# Patient Record
Sex: Female | Born: 1955 | Race: White | Hispanic: No | Marital: Single | Smoking: Current every day smoker
Health system: Southern US, Community
[De-identification: ages and names within clinical notes are randomized; demographics above are authoritative.]

## PROBLEM LIST (undated history)

## (undated) DIAGNOSIS — F32A Depression, unspecified: Secondary | ICD-10-CM

## (undated) DIAGNOSIS — D649 Anemia, unspecified: Secondary | ICD-10-CM

## (undated) DIAGNOSIS — F419 Anxiety disorder, unspecified: Secondary | ICD-10-CM

## (undated) DIAGNOSIS — E079 Disorder of thyroid, unspecified: Secondary | ICD-10-CM

## (undated) DIAGNOSIS — M810 Age-related osteoporosis without current pathological fracture: Secondary | ICD-10-CM

## (undated) DIAGNOSIS — T7840XA Allergy, unspecified, initial encounter: Secondary | ICD-10-CM

## (undated) DIAGNOSIS — F329 Major depressive disorder, single episode, unspecified: Secondary | ICD-10-CM

## (undated) HISTORY — DX: Allergy, unspecified, initial encounter: T78.40XA

## (undated) HISTORY — DX: Anxiety disorder, unspecified: F41.9

## (undated) HISTORY — DX: Depression, unspecified: F32.A

## (undated) HISTORY — DX: Anemia, unspecified: D64.9

## (undated) HISTORY — PX: UPPER GASTROINTESTINAL ENDOSCOPY: SHX188

## (undated) HISTORY — DX: Disorder of thyroid, unspecified: E07.9

## (undated) HISTORY — PX: HEMORRHOID SURGERY: SHX153

## (undated) HISTORY — DX: Age-related osteoporosis without current pathological fracture: M81.0

---
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
## 1995-01-22 HISTORY — PX: SPINE SURGERY: SHX786

## 1997-05-27 ENCOUNTER — Encounter: Admission: RE | Admit: 1997-05-27 | Discharge: 1997-05-27 | Payer: Self-pay | Admitting: Family Medicine

## 1997-05-30 ENCOUNTER — Encounter: Admission: RE | Admit: 1997-05-30 | Discharge: 1997-05-30 | Payer: Self-pay | Admitting: Family Medicine

## 1998-01-16 ENCOUNTER — Encounter: Admission: RE | Admit: 1998-01-16 | Discharge: 1998-01-16 | Payer: Self-pay | Admitting: Family Medicine

## 1999-01-05 ENCOUNTER — Encounter: Admission: RE | Admit: 1999-01-05 | Discharge: 1999-01-05 | Payer: Self-pay | Admitting: Family Medicine

## 1999-01-26 ENCOUNTER — Encounter: Admission: RE | Admit: 1999-01-26 | Discharge: 1999-01-26 | Payer: Self-pay | Admitting: Family Medicine

## 1999-02-05 ENCOUNTER — Encounter: Admission: RE | Admit: 1999-02-05 | Discharge: 1999-02-05 | Payer: Self-pay | Admitting: Family Medicine

## 1999-03-14 ENCOUNTER — Ambulatory Visit (HOSPITAL_COMMUNITY): Admission: RE | Admit: 1999-03-14 | Discharge: 1999-03-14 | Payer: Self-pay | Admitting: Family Medicine

## 1999-03-15 ENCOUNTER — Encounter: Payer: Self-pay | Admitting: Family Medicine

## 1999-03-19 ENCOUNTER — Encounter: Admission: RE | Admit: 1999-03-19 | Discharge: 1999-03-19 | Payer: Self-pay | Admitting: Family Medicine

## 1999-09-14 ENCOUNTER — Encounter: Admission: RE | Admit: 1999-09-14 | Discharge: 1999-09-14 | Payer: Self-pay | Admitting: Family Medicine

## 1999-09-19 ENCOUNTER — Encounter: Admission: RE | Admit: 1999-09-19 | Discharge: 1999-09-19 | Payer: Self-pay | Admitting: Family Medicine

## 1999-09-28 ENCOUNTER — Encounter: Admission: RE | Admit: 1999-09-28 | Discharge: 1999-09-28 | Payer: Self-pay | Admitting: Family Medicine

## 1999-12-03 ENCOUNTER — Encounter: Admission: RE | Admit: 1999-12-03 | Discharge: 1999-12-03 | Payer: Self-pay | Admitting: Family Medicine

## 2000-03-14 ENCOUNTER — Encounter: Admission: RE | Admit: 2000-03-14 | Discharge: 2000-03-14 | Payer: Self-pay | Admitting: Family Medicine

## 2000-12-26 ENCOUNTER — Encounter: Admission: RE | Admit: 2000-12-26 | Discharge: 2000-12-26 | Payer: Self-pay | Admitting: Family Medicine

## 2001-06-12 ENCOUNTER — Encounter: Admission: RE | Admit: 2001-06-12 | Discharge: 2001-06-12 | Payer: Self-pay | Admitting: Family Medicine

## 2001-06-29 ENCOUNTER — Encounter: Admission: RE | Admit: 2001-06-29 | Discharge: 2001-06-29 | Payer: Self-pay | Admitting: Family Medicine

## 2001-07-20 ENCOUNTER — Encounter: Admission: RE | Admit: 2001-07-20 | Discharge: 2001-07-20 | Payer: Self-pay | Admitting: Family Medicine

## 2002-01-26 ENCOUNTER — Encounter: Admission: RE | Admit: 2002-01-26 | Discharge: 2002-01-26 | Payer: Self-pay | Admitting: Family Medicine

## 2002-09-22 ENCOUNTER — Encounter: Admission: RE | Admit: 2002-09-22 | Discharge: 2002-09-22 | Payer: Self-pay | Admitting: Family Medicine

## 2002-11-15 ENCOUNTER — Encounter: Admission: RE | Admit: 2002-11-15 | Discharge: 2002-11-15 | Payer: Self-pay | Admitting: Family Medicine

## 2002-12-06 ENCOUNTER — Encounter: Admission: RE | Admit: 2002-12-06 | Discharge: 2002-12-06 | Payer: Self-pay | Admitting: Family Medicine

## 2002-12-09 ENCOUNTER — Encounter: Admission: RE | Admit: 2002-12-09 | Discharge: 2002-12-09 | Payer: Self-pay | Admitting: Sports Medicine

## 2003-01-03 ENCOUNTER — Encounter: Admission: RE | Admit: 2003-01-03 | Discharge: 2003-01-03 | Payer: Self-pay | Admitting: Family Medicine

## 2003-01-22 ENCOUNTER — Encounter (INDEPENDENT_AMBULATORY_CARE_PROVIDER_SITE_OTHER): Payer: Self-pay | Admitting: *Deleted

## 2003-01-22 LAB — CONVERTED CEMR LAB

## 2003-01-31 ENCOUNTER — Encounter: Admission: RE | Admit: 2003-01-31 | Discharge: 2003-01-31 | Payer: Self-pay | Admitting: Family Medicine

## 2003-02-01 ENCOUNTER — Encounter: Admission: RE | Admit: 2003-02-01 | Discharge: 2003-02-01 | Payer: Self-pay | Admitting: Family Medicine

## 2003-02-21 ENCOUNTER — Encounter: Admission: RE | Admit: 2003-02-21 | Discharge: 2003-02-21 | Payer: Self-pay | Admitting: Sports Medicine

## 2003-03-21 ENCOUNTER — Encounter: Admission: RE | Admit: 2003-03-21 | Discharge: 2003-03-21 | Payer: Self-pay | Admitting: Family Medicine

## 2003-11-17 ENCOUNTER — Ambulatory Visit: Payer: Self-pay | Admitting: Family Medicine

## 2003-11-22 ENCOUNTER — Ambulatory Visit: Payer: Self-pay | Admitting: Family Medicine

## 2003-12-12 ENCOUNTER — Ambulatory Visit: Payer: Self-pay | Admitting: Family Medicine

## 2003-12-26 ENCOUNTER — Ambulatory Visit: Payer: Self-pay | Admitting: Family Medicine

## 2004-01-27 ENCOUNTER — Ambulatory Visit: Payer: Self-pay | Admitting: Family Medicine

## 2004-02-05 ENCOUNTER — Emergency Department (HOSPITAL_COMMUNITY): Admission: EM | Admit: 2004-02-05 | Discharge: 2004-02-05 | Payer: Self-pay | Admitting: Family Medicine

## 2004-11-05 ENCOUNTER — Ambulatory Visit: Payer: Self-pay | Admitting: Family Medicine

## 2005-03-11 ENCOUNTER — Ambulatory Visit: Payer: Self-pay | Admitting: Family Medicine

## 2005-04-12 ENCOUNTER — Ambulatory Visit: Payer: Self-pay | Admitting: Family Medicine

## 2005-09-02 ENCOUNTER — Ambulatory Visit: Payer: Self-pay | Admitting: Family Medicine

## 2006-03-20 DIAGNOSIS — L719 Rosacea, unspecified: Secondary | ICD-10-CM

## 2006-03-20 DIAGNOSIS — F4323 Adjustment disorder with mixed anxiety and depressed mood: Secondary | ICD-10-CM

## 2006-03-20 DIAGNOSIS — E065 Other chronic thyroiditis: Secondary | ICD-10-CM

## 2006-03-20 DIAGNOSIS — F17200 Nicotine dependence, unspecified, uncomplicated: Secondary | ICD-10-CM

## 2006-03-20 DIAGNOSIS — M545 Low back pain, unspecified: Secondary | ICD-10-CM | POA: Insufficient documentation

## 2006-03-20 DIAGNOSIS — M5382 Other specified dorsopathies, cervical region: Secondary | ICD-10-CM

## 2006-03-20 DIAGNOSIS — R002 Palpitations: Secondary | ICD-10-CM

## 2006-03-20 DIAGNOSIS — E0590 Thyrotoxicosis, unspecified without thyrotoxic crisis or storm: Secondary | ICD-10-CM | POA: Insufficient documentation

## 2006-03-20 DIAGNOSIS — M479 Spondylosis, unspecified: Secondary | ICD-10-CM

## 2006-03-20 DIAGNOSIS — Z8639 Personal history of other endocrine, nutritional and metabolic disease: Secondary | ICD-10-CM

## 2006-03-20 HISTORY — DX: Spondylosis, unspecified: M47.9

## 2006-03-20 HISTORY — DX: Adjustment disorder with mixed anxiety and depressed mood: F43.23

## 2006-03-20 HISTORY — DX: Personal history of other endocrine, nutritional and metabolic disease: Z86.39

## 2006-03-20 HISTORY — DX: Rosacea, unspecified: L71.9

## 2006-03-20 HISTORY — DX: Nicotine dependence, unspecified, uncomplicated: F17.200

## 2006-03-21 ENCOUNTER — Encounter (INDEPENDENT_AMBULATORY_CARE_PROVIDER_SITE_OTHER): Payer: Self-pay | Admitting: *Deleted

## 2007-01-22 DIAGNOSIS — H269 Unspecified cataract: Secondary | ICD-10-CM

## 2007-01-22 HISTORY — DX: Unspecified cataract: H26.9

## 2007-01-22 HISTORY — PX: HEMORRHOID SURGERY: SHX153

## 2009-12-20 LAB — HM COLONOSCOPY: Colonoscopy, External: NORMAL

## 2010-12-19 ENCOUNTER — Encounter: Payer: Self-pay | Admitting: Family Medicine

## 2010-12-19 ENCOUNTER — Ambulatory Visit (INDEPENDENT_AMBULATORY_CARE_PROVIDER_SITE_OTHER): Payer: Self-pay | Admitting: Family Medicine

## 2010-12-19 VITALS — BP 99/74 | HR 77 | Temp 98.7°F | Ht 61.6 in | Wt 133.0 lb

## 2010-12-19 DIAGNOSIS — M8580 Other specified disorders of bone density and structure, unspecified site: Secondary | ICD-10-CM | POA: Insufficient documentation

## 2010-12-19 DIAGNOSIS — Z1239 Encounter for other screening for malignant neoplasm of breast: Secondary | ICD-10-CM

## 2010-12-19 DIAGNOSIS — F17200 Nicotine dependence, unspecified, uncomplicated: Secondary | ICD-10-CM

## 2010-12-19 DIAGNOSIS — E7800 Pure hypercholesterolemia, unspecified: Secondary | ICD-10-CM

## 2010-12-19 DIAGNOSIS — Z23 Encounter for immunization: Secondary | ICD-10-CM

## 2010-12-19 DIAGNOSIS — Z1231 Encounter for screening mammogram for malignant neoplasm of breast: Secondary | ICD-10-CM

## 2010-12-19 DIAGNOSIS — Z0000 Encounter for general adult medical examination without abnormal findings: Secondary | ICD-10-CM | POA: Insufficient documentation

## 2010-12-19 DIAGNOSIS — K635 Polyp of colon: Secondary | ICD-10-CM

## 2010-12-19 DIAGNOSIS — D126 Benign neoplasm of colon, unspecified: Secondary | ICD-10-CM

## 2010-12-19 DIAGNOSIS — Z111 Encounter for screening for respiratory tuberculosis: Secondary | ICD-10-CM

## 2010-12-19 DIAGNOSIS — M899 Disorder of bone, unspecified: Secondary | ICD-10-CM

## 2010-12-19 DIAGNOSIS — E0500 Thyrotoxicosis with diffuse goiter without thyrotoxic crisis or storm: Secondary | ICD-10-CM

## 2010-12-19 DIAGNOSIS — F329 Major depressive disorder, single episode, unspecified: Secondary | ICD-10-CM

## 2010-12-19 HISTORY — DX: Polyp of colon: K63.5

## 2010-12-19 HISTORY — DX: Encounter for general adult medical examination without abnormal findings: Z00.00

## 2010-12-19 HISTORY — DX: Other specified disorders of bone density and structure, unspecified site: M85.80

## 2010-12-19 HISTORY — DX: Pure hypercholesterolemia, unspecified: E78.00

## 2010-12-19 LAB — LIPID PANEL
Cholesterol: 224 mg/dL — ABNORMAL HIGH (ref 0–200)
Total CHOL/HDL Ratio: 4 Ratio

## 2010-12-19 LAB — COMPLETE METABOLIC PANEL WITH GFR
Albumin: 4.8 g/dL (ref 3.5–5.2)
Alkaline Phosphatase: 59 U/L (ref 39–117)
BUN: 12 mg/dL (ref 6–23)
CO2: 28 mEq/L (ref 19–32)
Calcium: 10 mg/dL (ref 8.4–10.5)
Chloride: 104 mEq/L (ref 96–112)
GFR, Est African American: >89 mL/min
GFR, Est Non African American: >89 mL/min
Glucose, Bld: 87 mg/dL (ref 70–99)
Potassium: 4.1 mEq/L (ref 3.5–5.3)
Sodium: 142 mEq/L (ref 135–145)
Total Protein: 7 g/dL (ref 6.0–8.3)

## 2010-12-19 LAB — TSH: TSH: 1.588 u[IU]/mL (ref 0.350–4.500)

## 2010-12-19 NOTE — Assessment & Plan Note (Signed)
Provided counseling resources.

## 2010-12-19 NOTE — Progress Notes (Signed)
  Subjective:    Patient ID: Anna Valenzuela, female    DOB: Mar 20, 1955, 55 y.o.   MRN: [id number]  HPI Establish care.  Actually, I have known Kaydan for years, but she has been absent from my practice for about three years.  Now returning. Difficulty sleeping. History of low bone density and low vitamin D Needs pap - prefers female provider for that aspect of care.  Never abnormal pap so q3y OK.  Wants to be checked for STDs at that visit. Needs colonoscopy: had one about three years ago and told multiple polyps and needed repeat in 3 years (now.) Depression: emerging from another down cycle of her chronic depression.  She has just broken up with her latest husband.  Has not done well on antidepressants in the past.  She has moved to [facility] and I instructed her on available counciling services.    Review of Systems     Objective:   Physical Exam Lungs clear Cardiac RRR Abd benign         Assessment & Plan:

## 2010-12-19 NOTE — Assessment & Plan Note (Signed)
Urged cessation 

## 2010-12-19 NOTE — Assessment & Plan Note (Signed)
Told polyps and due for colonoscopy.  Will get old records and refer

## 2010-12-19 NOTE — Assessment & Plan Note (Addendum)
Told by last MD.  [facility] Du[facility] labs.  Since I do not know pharmacy, will send printed script with results letter.

## 2010-12-19 NOTE — Assessment & Plan Note (Signed)
Check TSH.  Has been euthyroid of late.

## 2010-12-19 NOTE — Patient Instructions (Signed)
Great seeing you again I will call with blood work results See one of our female providers soon for a pap smear and STD check The nurse will set you up for a mammogram and GI referral for colonoscopy You got a tetanus shot today Come back Friday to have your TB test read See me after the new year when we can talk more.

## 2010-12-19 NOTE — Assessment & Plan Note (Signed)
Told both osteopenia and low vitamin D by last MD. Hennie Duos labs

## 2010-12-19 NOTE — Assessment & Plan Note (Signed)
Needs mammogram

## 2010-12-20 ENCOUNTER — Encounter: Payer: Self-pay | Admitting: Family Medicine

## 2010-12-20 LAB — VITAMIN D 25 HYDROXY (VIT D DEFICIENCY, FRACTURES): Vit D, 25-Hydroxy: 51 ng/mL (ref 30–89)

## 2010-12-20 MED ORDER — PRAVASTATIN SODIUM 40 MG PO TABS: 40.0000 mg | ORAL_TABLET | Freq: Every evening | ORAL | Status: DC

## 2010-12-20 NOTE — Progress Notes (Signed)
Addended by: Deno Etienne on: 12/20/2010 05:34 PM   Modules accepted: Orders

## 2010-12-20 NOTE — Progress Notes (Signed)
  Subjective:    Patient ID: Anna Valenzuela, female    DOB: 1955-12-08, 55 y.o.   MRN: [id number]  HPI Called and left message plus will send a letter.  Will start on statin for high LDL.   Review of Systems     Objective:   Physical Exam        Assessment & Plan:

## 2010-12-20 NOTE — Progress Notes (Signed)
Addended by: Tivis Ringer on: 12/20/2010 10:25 AM   Modules accepted: Orders

## 2010-12-21 ENCOUNTER — Telehealth: Payer: Self-pay | Admitting: *Deleted

## 2010-12-21 ENCOUNTER — Ambulatory Visit (INDEPENDENT_AMBULATORY_CARE_PROVIDER_SITE_OTHER): Payer: Self-pay | Admitting: *Deleted

## 2010-12-21 DIAGNOSIS — IMO0001 Reserved for inherently not codable concepts without codable children: Secondary | ICD-10-CM

## 2010-12-21 DIAGNOSIS — Z111 Encounter for screening for respiratory tuberculosis: Secondary | ICD-10-CM

## 2010-12-21 LAB — TB SKIN TEST: TB Skin Test: NEGATIVE mm

## 2010-12-21 NOTE — Telephone Encounter (Signed)
Has been to see [facility] - 2010 - colonoscopy & hemorrhoid surgery 2011  Will be coming in today to have PPD read and will sign a release form to get this info from her PCP in [place].

## 2010-12-21 NOTE — Progress Notes (Signed)
Addended by: Deno Etienne on: 12/21/2010 12:02 PM   Modules accepted: Orders

## 2010-12-21 NOTE — Telephone Encounter (Signed)
Called pt to ask if she has ever seen a GI doctor or had a colonoscopy done.Anna Valenzuela [place]

## 2011-02-28 ENCOUNTER — Ambulatory Visit (INDEPENDENT_AMBULATORY_CARE_PROVIDER_SITE_OTHER): Payer: Self-pay | Admitting: Family Medicine

## 2011-02-28 VITALS — BP 99/76 | HR 93 | Ht 61.5 in | Wt 130.0 lb

## 2011-02-28 DIAGNOSIS — M62830 Muscle spasm of back: Secondary | ICD-10-CM | POA: Insufficient documentation

## 2011-02-28 DIAGNOSIS — M539 Dorsopathy, unspecified: Secondary | ICD-10-CM

## 2011-02-28 MED ORDER — ACETAMINOPHEN-CODEINE #3 300-30 MG PO TABS: 1.0000 | ORAL_TABLET | Freq: Four times a day (QID) | ORAL | Status: AC | PRN

## 2011-02-28 NOTE — Assessment & Plan Note (Signed)
Aggravated by moving her family. Likely worsened by the increased stress she's been having. She is already taking methocarbamol which is provided by her primary care physician. She therefore does not need any further muscle relaxants. Provided her with Tylenol #3 as noted for relief. She states this has helped her in the past when she has had similar pain. Also recommended massage and heat. Followup for prevention weeks. No red flags by history or exam.

## 2011-02-28 NOTE — Progress Notes (Signed)
  Subjective:    Patient ID: Anna Valenzuela, female    DOB: April 07, 1955, 56 y.o.   MRN: [id number]  HPI Back pain:  Describes aching pain in upper region of back and neck, worse when moveing Left arm.  Pain is 8 / 10, not relieved with ibuprofen 800 mg or methocarbamol.  Recently helped her family moved from [place] to [place]. This is a very stressful move as her son had lost his house secondary to credit problems.  Has had increased stresses at home and at work.  No injuries to her back.  No numbness or paresthesias to bilateral lower extremities.  No LE weakness or changes in gait.  No fevers or chills.  No incontinence of bladder or bowel.    Of note she thought she was having chest pain he called EMS on Tuesday. They came and did an EKG which was within normal limits. She provides the original copy of EKG in clinic here today. As her EKG was good and she began having more back and not chest pain she refused to the hospital. She denied any further chest pain since then.   Review of Systems See HPI above for review of systems.       Objective:   Physical Exam Gen:  Alert, cooperative patient who appears stated age in no acute distress.  Vital signs reviewed.  Patient is sitting and holding her left arm close to her side. Back - Normal skin, Spine with normal alignment and no deformity.  No tenderness to vertebral process palpation.  Paraspinous muscles are tender on left side. Multiple triggerpoints noted. Also tender throughout trapezius on left side. Some minimal tenderness across left side of chest..   Range of motion is full at neck and lumbar sacral regions         Assessment & Plan:

## 2011-02-28 NOTE — Patient Instructions (Signed)
Take 0.5 to 1 tab for relief. Use heat and massage as much as possible. Come back and see me in 2 weeks so we can make sure you're okay.

## 2011-03-07 ENCOUNTER — Encounter: Payer: Self-pay | Admitting: Gastroenterology

## 2011-03-13 ENCOUNTER — Ambulatory Visit: Payer: Self-pay | Admitting: Family Medicine

## 2011-04-05 ENCOUNTER — Ambulatory Visit (INDEPENDENT_AMBULATORY_CARE_PROVIDER_SITE_OTHER): Payer: Self-pay | Admitting: Family Medicine

## 2011-04-05 ENCOUNTER — Encounter: Payer: Self-pay | Admitting: Family Medicine

## 2011-04-05 VITALS — BP 100/66 | HR 63 | Temp 98.3°F | Ht 61.5 in | Wt 131.0 lb

## 2011-04-05 DIAGNOSIS — J329 Chronic sinusitis, unspecified: Secondary | ICD-10-CM

## 2011-04-05 DIAGNOSIS — B9789 Other viral agents as the cause of diseases classified elsewhere: Secondary | ICD-10-CM

## 2011-04-05 NOTE — Progress Notes (Signed)
  Subjective:    Patient ID: Anna Valenzuela, female    DOB: Dec 01, 1955, 56 y.o.   MRN: [id number]  HPI Sinus congestion: X5 days. Also has positive cough, sinus pressure in area of maxillary sinuses and frontal sinus. Positive dental pain on left side.  Patient also endorses watery eyes, itchy eyes, positive nasal drainage, positive sneezing. No fever. No nausea vomiting or diarrhea. Did have a GI bug approximately one to 2 weeks ago. This is now resolved. Was feeling better until this came on 5 days ago. Patient's grandson is now living with her as of recently. She goes into the school to pick him up. Is concerned she may picked up a virus better. Difficulty sleeping do to symptoms.  Review of Systems    as per above. Objective:   Physical Exam  HENT:  Head: Normocephalic and atraumatic.  Right Ear: External ear normal.  Left Ear: External ear normal.  Mouth/Throat: No oropharyngeal exudate.       inflammed nasal mucosa. Clear nasal drainage  + tenderness to palpation over maxillary area and forehead. Mild throat erythema.   Eyes: Pupils are equal, round, and reactive to light. Right eye exhibits no discharge. Left eye exhibits no discharge.  Neck: Neck supple.  Cardiovascular: Normal rate, regular rhythm and normal heart sounds.   No murmur heard. Pulmonary/Chest: Effort normal and breath sounds normal. No respiratory distress. She has no wheezes. She has no rales.  Abdominal: Soft. She exhibits no distension. There is no tenderness.  Musculoskeletal: She exhibits no edema.  Lymphadenopathy:    She has no cervical adenopathy.  Neurological: She is alert.  Skin: Skin is warm. No rash noted.          Assessment & Plan:

## 2011-04-05 NOTE — Patient Instructions (Signed)
Viral:   Nasal congestion: Nasal saline spray as needed Afrin as directed for 3 days only. Body aches, sinus pain: Motrin 800mg  every 8 hours as needed.  Cough: Honey as needed, mucinex as needed for cough.  Allergic: Claritin or zyrtec as directed.  I want you to return if: High fevers, new or worsening of symptoms, or if symptoms last longer than 2 weeks.

## 2011-04-05 NOTE — Assessment & Plan Note (Signed)
Symptoms consistent with viral sinusitis- may have an allergic component.  See pt instructions- symptomatic treatment only at this time.  Reviewed red flags for return with patient.

## 2011-04-10 ENCOUNTER — Ambulatory Visit (INDEPENDENT_AMBULATORY_CARE_PROVIDER_SITE_OTHER): Payer: Self-pay | Admitting: Family Medicine

## 2011-04-10 ENCOUNTER — Encounter: Payer: Self-pay | Admitting: Family Medicine

## 2011-04-10 VITALS — BP 108/74 | HR 83 | Temp 98.4°F | Ht 61.5 in | Wt 130.0 lb

## 2011-04-10 DIAGNOSIS — J329 Chronic sinusitis, unspecified: Secondary | ICD-10-CM

## 2011-04-10 MED ORDER — MOMETASONE FUROATE 50 MCG/ACT NA SUSP: 2.0000 | Freq: Every day | NASAL | Status: DC

## 2011-04-10 MED ORDER — AZITHROMYCIN 250 MG PO TABS: ORAL_TABLET | ORAL | Status: AC

## 2011-04-10 NOTE — Patient Instructions (Signed)

## 2011-04-10 NOTE — Progress Notes (Signed)
  Subjective:    Patient ID: Anna Valenzuela, female    DOB: 1955/08/18, 56 y.o.   MRN: [id number]  URI  This is a recurrent problem. The current episode started 1 to 4 weeks ago. The maximum temperature recorded prior to her arrival was 100 - 100.9 F. The fever has been present for 3 to 4 days. Associated symptoms include coughing, rhinorrhea, sinus pain and a sore throat. Pertinent negatives include no abdominal pain, diarrhea, dysuria, nausea or wheezing. She has tried antihistamine and decongestant for the symptoms. The treatment provided no relief.  Seen here last week and has not improved.  Mucous drainage is now thick, green and is coughing more.  Still supports sinus tenderness, tooth pain, left ear pain.    Review of Systems  Constitutional: Negative for diaphoresis and fatigue.  HENT: Positive for sore throat and rhinorrhea.   Respiratory: Positive for cough. Negative for wheezing.   Gastrointestinal: Negative for nausea, abdominal pain and diarrhea.  Genitourinary: Negative for dysuria and difficulty urinating.  Musculoskeletal: Negative for back pain.       Objective:   Physical Exam  Vitals reviewed. Constitutional: She is oriented to person, place, and time. She appears well-developed and well-nourished.  HENT:  Head: Atraumatic.  Right Ear: Tympanic membrane and ear canal normal.  Left Ear: Ear canal normal. Tympanic membrane is bulging. A middle ear effusion is present.  Nose: Mucosal edema and rhinorrhea present. Left sinus exhibits maxillary sinus tenderness.  Mouth/Throat: Posterior oropharyngeal edema present. No oropharyngeal exudate.  Eyes: No scleral icterus.  Neck: Neck supple.  Cardiovascular: Normal rate and regular rhythm.   Pulmonary/Chest: Effort normal and breath sounds normal.  Lymphadenopathy:    She has no cervical adenopathy.  Neurological: She is alert and oriented to person, place, and time.          Assessment & Plan:   1. Sinusitis   azithromycin (ZITHROMAX Z-PAK) 250 MG tablet

## 2011-06-28 ENCOUNTER — Ambulatory Visit (INDEPENDENT_AMBULATORY_CARE_PROVIDER_SITE_OTHER): Payer: Self-pay | Admitting: Family Medicine

## 2011-06-28 ENCOUNTER — Encounter: Payer: Self-pay | Admitting: Family Medicine

## 2011-06-28 VITALS — BP 100/68 | HR 76 | Temp 98.3°F | Ht 61.6 in | Wt 131.0 lb

## 2011-06-28 DIAGNOSIS — F329 Major depressive disorder, single episode, unspecified: Secondary | ICD-10-CM

## 2011-06-28 DIAGNOSIS — D126 Benign neoplasm of colon, unspecified: Secondary | ICD-10-CM

## 2011-06-28 DIAGNOSIS — E0500 Thyrotoxicosis with diffuse goiter without thyrotoxic crisis or storm: Secondary | ICD-10-CM

## 2011-06-28 DIAGNOSIS — E7800 Pure hypercholesterolemia, unspecified: Secondary | ICD-10-CM

## 2011-06-28 DIAGNOSIS — K635 Polyp of colon: Secondary | ICD-10-CM

## 2011-06-28 DIAGNOSIS — M479 Spondylosis, unspecified: Secondary | ICD-10-CM

## 2011-06-28 MED ORDER — LORAZEPAM 0.5 MG PO TABS: 0.5000 mg | ORAL_TABLET | Freq: Three times a day (TID) | ORAL | Status: DC | PRN

## 2011-06-28 MED ORDER — IBUPROFEN 800 MG PO TABS: 800.0000 mg | ORAL_TABLET | Freq: Three times a day (TID) | ORAL | Status: DC | PRN

## 2011-06-28 MED ORDER — PRAVASTATIN SODIUM 40 MG PO TABS: 40.0000 mg | ORAL_TABLET | Freq: Every evening | ORAL | Status: DC

## 2011-06-28 MED ORDER — METHOCARBAMOL 750 MG PO TABS: 750.0000 mg | ORAL_TABLET | Freq: Four times a day (QID) | ORAL | Status: DC | PRN

## 2011-06-28 NOTE — Patient Instructions (Signed)
Get your meds filled, including the cholesterol medication. Come back at your earliest convenience to Dr. Shawnie Pons for a Pap smear As soon as you can, you need another colonoscopy. Please get your mammogram done. I will call with the thyroid results.

## 2011-06-30 NOTE — Assessment & Plan Note (Signed)
Has not been taking meds.  I will need to recheck when on meds and titrate up dose.

## 2011-06-30 NOTE — Assessment & Plan Note (Addendum)
Due for colonoscopy.  She knows but the finances are difficult.
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
## 2011-06-30 NOTE — Assessment & Plan Note (Signed)
Recheck TSH 

## 2011-07-18 ENCOUNTER — Telehealth: Payer: Self-pay | Admitting: Family Medicine

## 2011-07-18 NOTE — Telephone Encounter (Signed)
Pt rec'd message from Dr Leveda Anna and she doesn't want to change meds - she is very satisfied with Prevastatin and doesn't want to change.

## 2011-07-19 NOTE — Telephone Encounter (Signed)
MAP forms completed.

## 2011-07-22 ENCOUNTER — Encounter: Payer: Self-pay | Admitting: Family Medicine

## 2011-07-22 ENCOUNTER — Other Ambulatory Visit (HOSPITAL_COMMUNITY)
Admission: RE | Admit: 2011-07-22 | Discharge: 2011-07-22 | Disposition: A | Discharge status: Home / Self Care | Payer: Self-pay | Source: Ambulatory Visit | Attending: Family Medicine | Admitting: Family Medicine

## 2011-07-22 ENCOUNTER — Ambulatory Visit (INDEPENDENT_AMBULATORY_CARE_PROVIDER_SITE_OTHER): Payer: Self-pay | Admitting: Family Medicine

## 2011-07-22 VITALS — BP 95/66 | HR 80 | Temp 98.6°F | Wt 126.0 lb

## 2011-07-22 DIAGNOSIS — Z01419 Encounter for gynecological examination (general) (routine) without abnormal findings: Secondary | ICD-10-CM

## 2011-07-22 DIAGNOSIS — Z1231 Encounter for screening mammogram for malignant neoplasm of breast: Secondary | ICD-10-CM

## 2011-07-22 DIAGNOSIS — Z124 Encounter for screening for malignant neoplasm of cervix: Secondary | ICD-10-CM

## 2011-07-22 NOTE — Patient Instructions (Signed)
Preventive Care for Adults, Female A healthy lifestyle and preventive care can promote health and wellness. Preventive health guidelines for women include the following key practices.  A routine yearly physical is a good way to check with your caregiver about your health and preventive screening. It is a chance to share any concerns and updates on your health, and to receive a thorough exam.   Visit your dentist for a routine exam and preventive care every 6 months. Brush your teeth twice a day and floss once a day. Good oral hygiene prevents tooth decay and gum disease.   The frequency of eye exams is based on your age, health, family medical history, use of contact lenses, and other factors. Follow your caregiver's recommendations for frequency of eye exams.   Eat a healthy diet. Foods like vegetables, fruits, whole grains, low-fat dairy products, and lean protein foods contain the nutrients you need without too many calories. Decrease your intake of foods high in solid fats, added sugars, and salt. Eat the right amount of calories for you.Get information about a proper diet from your caregiver, if necessary.   Regular physical exercise is one of the most important things you can do for your health. Most adults should get at least 150 minutes of moderate-intensity exercise (any activity that increases your heart rate and causes you to sweat) each week. In addition, most adults need muscle-strengthening exercises on 2 or more days a week.   Maintain a healthy weight. The body mass index (BMI) is a screening tool to identify possible weight problems. It provides an estimate of body fat based on height and weight. Your caregiver can help determine your BMI, and can help you achieve or maintain a healthy weight.For adults 20 years and older:   A BMI below 18.5 is considered underweight.   A BMI of 18.5 to 24.9 is normal.   A BMI of 25 to 29.9 is considered overweight.   A BMI of 30 and above is  considered obese.   Maintain normal blood lipids and cholesterol levels by exercising and minimizing your intake of saturated fat. Eat a balanced diet with plenty of fruit and vegetables. Blood tests for lipids and cholesterol should begin at age 20 and be repeated every 5 years. If your lipid or cholesterol levels are high, you are over 50, or you are at high risk for heart disease, you may need your cholesterol levels checked more frequently.Ongoing high lipid and cholesterol levels should be treated with medicines if diet and exercise are not effective.   If you smoke, find out from your caregiver how to quit. If you do not use tobacco, do not start.   If you are pregnant, do not drink alcohol. If you are breastfeeding, be very cautious about drinking alcohol. If you are not pregnant and choose to drink alcohol, do not exceed 1 drink per day. One drink is considered to be 12 ounces (355 mL) of beer, 5 ounces (148 mL) of wine, or 1.5 ounces (44 mL) of liquor.   Avoid use of street drugs. Do not share needles with anyone. Ask for help if you need support or instructions about stopping the use of drugs.   High blood pressure causes heart disease and increases the risk of stroke. Your blood pressure should be checked at least every 1 to 2 years. Ongoing high blood pressure should be treated with medicines if weight loss and exercise are not effective.   If you are 55 to 55   years old, ask your caregiver if you should take aspirin to prevent strokes.   Diabetes screening involves taking a blood sample to check your fasting blood sugar level. This should be done once every 3 years, after age 45, if you are within normal weight and without risk factors for diabetes. Testing should be considered at a younger age or be carried out more frequently if you are overweight and have at least 1 risk factor for diabetes.   Breast cancer screening is essential preventive care for women. You should practice "breast  self-awareness." This means understanding the normal appearance and feel of your breasts and may include breast self-examination. Any changes detected, no matter how small, should be reported to a caregiver. Women in their 20s and 30s should have a clinical breast exam (CBE) by a caregiver as part of a regular health exam every 1 to 3 years. After age 40, women should have a CBE every year. Starting at age 40, women should consider having a mammography (breast X-ray test) every year. Women who have a family history of breast cancer should talk to their caregiver about genetic screening. Women at a high risk of breast cancer should talk to their caregivers about having magnetic resonance imaging (MRI) and a mammography every year.   The Pap test is a screening test for cervical cancer. A Pap test can show cell changes on the cervix that might become cervical cancer if left untreated. A Pap test is a procedure in which cells are obtained and examined from the lower end of the uterus (cervix).   Women should have a Pap test starting at age 21.   Between ages 21 and 29, Pap tests should be repeated every 2 years.   Beginning at age 30, you should have a Pap test every 3 years as long as the past 3 Pap tests have been normal.   Some women have medical problems that increase the chance of getting cervical cancer. Talk to your caregiver about these problems. It is especially important to talk to your caregiver if a new problem develops soon after your last Pap test. In these cases, your caregiver may recommend more frequent screening and Pap tests.   The above recommendations are the same for women who have or have not gotten the vaccine for human papillomavirus (HPV).   If you had a hysterectomy for a problem that was not cancer or a condition that could lead to cancer, then you no longer need Pap tests. Even if you no longer need a Pap test, a regular exam is a good idea to make sure no other problems are  starting.   If you are between ages 65 and 70, and you have had normal Pap tests going back 10 years, you no longer need Pap tests. Even if you no longer need a Pap test, a regular exam is a good idea to make sure no other problems are starting.   If you have had past treatment for cervical cancer or a condition that could lead to cancer, you need Pap tests and screening for cancer for at least 20 years after your treatment.   If Pap tests have been discontinued, risk factors (such as a new sexual partner) need to be reassessed to determine if screening should be resumed.   The HPV test is an additional test that may be used for cervical cancer screening. The HPV test looks for the virus that can cause the cell changes on the cervix.   The cells collected during the Pap test can be tested for HPV. The HPV test could be used to screen women aged 30 years and older, and should be used in women of any age who have unclear Pap test results. After the age of 30, women should have HPV testing at the same frequency as a Pap test.   Colorectal cancer can be detected and often prevented. Most routine colorectal cancer screening begins at the age of 50 and continues through age 75. However, your caregiver may recommend screening at an earlier age if you have risk factors for colon cancer. On a yearly basis, your caregiver may provide home test kits to check for hidden blood in the stool. Use of a small camera at the end of a tube, to directly examine the colon (sigmoidoscopy or colonoscopy), can detect the earliest forms of colorectal cancer. Talk to your caregiver about this at age 50, when routine screening begins. Direct examination of the colon should be repeated every 5 to 10 years through age 75, unless early forms of pre-cancerous polyps or small growths are found.   Hepatitis C blood testing is recommended for all people born from 1945 through 1965 and any individual with known risks for hepatitis C.    Practice safe sex. Use condoms and avoid high-risk sexual practices to reduce the spread of sexually transmitted infections (STIs). STIs include gonorrhea, chlamydia, syphilis, trichomonas, herpes, HPV, and human immunodeficiency virus (HIV). Herpes, HIV, and HPV are viral illnesses that have no cure. They can result in disability, cancer, and death. Sexually active women aged 25 and younger should be checked for chlamydia. Older women with new or multiple partners should also be tested for chlamydia. Testing for other STIs is recommended if you are sexually active and at increased risk.   Osteoporosis is a disease in which the bones lose minerals and strength with aging. This can result in serious bone fractures. The risk of osteoporosis can be identified using a bone density scan. Women ages 65 and over and women at risk for fractures or osteoporosis should discuss screening with their caregivers. Ask your caregiver whether you should take a calcium supplement or vitamin D to reduce the rate of osteoporosis.   Menopause can be associated with physical symptoms and risks. Hormone replacement therapy is available to decrease symptoms and risks. You should talk to your caregiver about whether hormone replacement therapy is right for you.   Use sunscreen with sun protection factor (SPF) of 30 or more. Apply sunscreen liberally and repeatedly throughout the day. You should seek shade when your shadow is shorter than you. Protect yourself by wearing long sleeves, pants, a wide-brimmed hat, and sunglasses year round, whenever you are outdoors.   Once a month, do a whole body skin exam, using a mirror to look at the skin on your back. Notify your caregiver of new moles, moles that have irregular borders, moles that are larger than a pencil eraser, or moles that have changed in shape or color.   Stay current with required immunizations.   Influenza. You need a dose every fall (or winter). The composition of  the flu vaccine changes each year, so being vaccinated once is not enough.   Pneumococcal polysaccharide. You need 1 to 2 doses if you smoke cigarettes or if you have certain chronic medical conditions. You need 1 dose at age 65 (or older) if you have never been vaccinated.   Tetanus, diphtheria, pertussis (Tdap, Td). Get 1 dose of   Tdap vaccine if you are younger than age 65, are over 65 and have contact with an infant, are a healthcare worker, are pregnant, or simply want to be protected from whooping cough. After that, you need a Td booster dose every 10 years. Consult your caregiver if you have not had at least 3 tetanus and diphtheria-containing shots sometime in your life or have a deep or dirty wound.   HPV. You need this vaccine if you are a woman age 26 or younger. The vaccine is given in 3 doses over 6 months.   Measles, mumps, rubella (MMR). You need at least 1 dose of MMR if you were born in 1957 or later. You may also need a second dose.   Meningococcal. If you are age 19 to 21 and a first-year college student living in a residence hall, or have one of several medical conditions, you need to get vaccinated against meningococcal disease. You may also need additional booster doses.   Zoster (shingles). If you are age 60 or older, you should get this vaccine.   Varicella (chickenpox). If you have never had chickenpox or you were vaccinated but received only 1 dose, talk to your caregiver to find out if you need this vaccine.   Hepatitis A. You need this vaccine if you have a specific risk factor for hepatitis A virus infection or you simply wish to be protected from this disease. The vaccine is usually given as 2 doses, 6 to 18 months apart.   Hepatitis B. You need this vaccine if you have a specific risk factor for hepatitis B virus infection or you simply wish to be protected from this disease. The vaccine is given in 3 doses, usually over 6 months.  Preventive Services /  Frequency Ages 19 to 39  Blood pressure check.** / Every 1 to 2 years.   Lipid and cholesterol check.** / Every 5 years beginning at age 20.   Clinical breast exam.** / Every 3 years for women in their 20s and 30s.   Pap test.** / Every 2 years from ages 21 through 29. Every 3 years starting at age 30 through age 65 or 70 with a history of 3 consecutive normal Pap tests.   HPV screening.** / Every 3 years from ages 30 through ages 65 to 70 with a history of 3 consecutive normal Pap tests.   Hepatitis C blood test.** / For any individual with known risks for hepatitis C.   Skin self-exam. / Monthly.   Influenza immunization.** / Every year.   Pneumococcal polysaccharide immunization.** / 1 to 2 doses if you smoke cigarettes or if you have certain chronic medical conditions.   Tetanus, diphtheria, pertussis (Tdap, Td) immunization. / A one-time dose of Tdap vaccine. After that, you need a Td booster dose every 10 years.   HPV immunization. / 3 doses over 6 months, if you are 26 and younger.   Measles, mumps, rubella (MMR) immunization. / You need at least 1 dose of MMR if you were born in 1957 or later. You may also need a second dose.   Meningococcal immunization. / 1 dose if you are age 19 to 21 and a first-year college student living in a residence hall, or have one of several medical conditions, you need to get vaccinated against meningococcal disease. You may also need additional booster doses.   Varicella immunization.** / Consult your caregiver.   Hepatitis A immunization.** / Consult your caregiver. 2 doses, 6 to 18 months   apart.   Hepatitis B immunization.** / Consult your caregiver. 3 doses usually over 6 months.  Ages 40 to 64  Blood pressure check.** / Every 1 to 2 years.   Lipid and cholesterol check.** / Every 5 years beginning at age 20.   Clinical breast exam.** / Every year after age 40.   Mammogram.** / Every year beginning at age 40 and continuing for as  long as you are in good health. Consult with your caregiver.   Pap test.** / Every 3 years starting at age 30 through age 65 or 70 with a history of 3 consecutive normal Pap tests.   HPV screening.** / Every 3 years from ages 30 through ages 65 to 70 with a history of 3 consecutive normal Pap tests.   Fecal occult blood test (FOBT) of stool. / Every year beginning at age 50 and continuing until age 75. You may not need to do this test if you get a colonoscopy every 10 years.   Flexible sigmoidoscopy or colonoscopy.** / Every 5 years for a flexible sigmoidoscopy or every 10 years for a colonoscopy beginning at age 50 and continuing until age 75.   Hepatitis C blood test.** / For all people born from 1945 through 1965 and any individual with known risks for hepatitis C.   Skin self-exam. / Monthly.   Influenza immunization.** / Every year.   Pneumococcal polysaccharide immunization.** / 1 to 2 doses if you smoke cigarettes or if you have certain chronic medical conditions.   Tetanus, diphtheria, pertussis (Tdap, Td) immunization.** / A one-time dose of Tdap vaccine. After that, you need a Td booster dose every 10 years.   Measles, mumps, rubella (MMR) immunization. / You need at least 1 dose of MMR if you were born in 1957 or later. You may also need a second dose.   Varicella immunization.** / Consult your caregiver.   Meningococcal immunization.** / Consult your caregiver.   Hepatitis A immunization.** / Consult your caregiver. 2 doses, 6 to 18 months apart.   Hepatitis B immunization.** / Consult your caregiver. 3 doses, usually over 6 months.  Ages 65 and over  Blood pressure check.** / Every 1 to 2 years.   Lipid and cholesterol check.** / Every 5 years beginning at age 20.   Clinical breast exam.** / Every year after age 40.   Mammogram.** / Every year beginning at age 40 and continuing for as long as you are in good health. Consult with your caregiver.   Pap test.** /  Every 3 years starting at age 30 through age 65 or 70 with a 3 consecutive normal Pap tests. Testing can be stopped between 65 and 70 with 3 consecutive normal Pap tests and no abnormal Pap or HPV tests in the past 10 years.   HPV screening.** / Every 3 years from ages 30 through ages 65 or 70 with a history of 3 consecutive normal Pap tests. Testing can be stopped between 65 and 70 with 3 consecutive normal Pap tests and no abnormal Pap or HPV tests in the past 10 years.   Fecal occult blood test (FOBT) of stool. / Every year beginning at age 50 and continuing until age 75. You may not need to do this test if you get a colonoscopy every 10 years.   Flexible sigmoidoscopy or colonoscopy.** / Every 5 years for a flexible sigmoidoscopy or every 10 years for a colonoscopy beginning at age 50 and continuing until age 75.   Hepatitis   C blood test.** / For all people born from [birth year] through 1965 and any individual with known risks for hepatitis C.   Osteoporosis screening.** / A one-time screening for women ages 64 and over and women at risk for fractures or osteoporosis.   Skin self-exam. / Monthly.   Influenza immunization.** / Every year.   Pneumococcal polysaccharide immunization.** / 1 dose at age 56 (or older) if you have never been vaccinated.   Tetanus, diphtheria, pertussis (Tdap, Td) immunization. / A one-time dose of Tdap vaccine if you are over 65 and have contact with an infant, are a Research scientist (physical sciences), or simply want to be protected from whooping cough. After that, you need a Td booster dose every 10 years.   Varicella immunization.** / Consult your caregiver.   Meningococcal immunization.** / Consult your caregiver.   Hepatitis A immunization.** / Consult your caregiver. 2 doses, 6 to 18 months apart.   Hepatitis B immunization.** / Check with your caregiver. 3 doses, usually over 6 months.  ** Family history and personal history of risk and conditions may change your caregiver's  recommendations. Document Released: 03/05/2001 Document Revised: 12/27/2010 Document Reviewed: 06/04/2010 [facility] Patient Information 2012 [place], [place].Smoking Cessation This document explains the best ways for you to quit smoking and new treatments to help. It lists new medicines that can double or triple your chances of quitting and quitting for good. It also considers ways to avoid relapses and concerns you may have about quitting, including weight gain. NICOTINE: A POWERFUL ADDICTION If you have tried to quit smoking, you know how hard it can be. It is hard because nicotine is a very addictive drug. For some people, it can be as addictive as heroin or cocaine. Usually, people make 2 or 3 tries, or more, before finally being able to quit. Each time you try to quit, you can learn about what helps and what hurts. Quitting takes hard work and a lot of effort, but you can quit smoking. QUITTING SMOKING IS ONE OF THE MOST IMPORTANT THINGS YOU WILL EVER DO.  You will live longer, feel better, and live better.   The impact on your body of quitting smoking is felt almost immediately:   Within 20 minutes, blood pressure decreases. Pulse returns to its normal level.   After 8 hours, carbon monoxide levels in the blood return to normal. Oxygen level increases.   After 24 hours, chance of heart attack starts to decrease. Breath, hair, and body stop smelling like smoke.   After 48 hours, damaged nerve endings begin to recover. Sense of taste and smell improve.   After 72 hours, the body is virtually free of nicotine. Bronchial tubes relax and breathing becomes easier.   After 2 to 12 weeks, lungs can hold more air. Exercise becomes easier and circulation improves.   Quitting will reduce your risk of having a heart attack, stroke, cancer, or lung disease:   After 1 year, the risk of coronary heart disease is cut in half.   After 5 years, the risk of stroke falls to the same as a nonsmoker.    After 10 years, the risk of lung cancer is cut in half and the risk of other cancers decreases significantly.   After 15 years, the risk of coronary heart disease drops, usually to the level of a nonsmoker.   If you are pregnant, quitting smoking will improve your chances of having a healthy baby.   The people you live with, especially your children, will  be healthier.   You will have extra money to spend on things other than cigarettes.  FIVE KEYS TO QUITTING Studies have shown that these 5 steps will help you quit smoking and quit for good. You have the best chances of quitting if you use them together: 1. Get ready.  2. Get support and encouragement.  3. Learn new skills and behaviors.  4. Get medicine to reduce your nicotine addiction and use it correctly.  5. Be prepared for relapse or difficult situations. Be determined to continue trying to quit, even if you do not succeed at first.  1. GET READY  Set a quit date.   Change your environment.   Get rid of ALL cigarettes, ashtrays, matches, and lighters in your home, car, and place of work.   Do not let people smoke in your home.   Review your past attempts to quit. Think about what worked and what did not.   Once you quit, do not smoke. NOT EVEN A PUFF!  2. GET SUPPORT AND ENCOURAGEMENT Studies have shown that you have a better chance of being successful if you have help. You can get support in many ways.  Tell your family, friends, and coworkers that you are going to quit and need their support. Ask them not to smoke around you.   Talk to your caregivers (doctor, dentist, nurse, pharmacist, psychologist, and/or smoking counselor).   Get individual, group, or telephone counseling and support. The more counseling you have, the better your chances are of quitting. Programs are available at [employer] and health centers. Call your local health department for information about programs in your area.   Spiritual beliefs  and practices may help some smokers quit.   Quit meters are Photographer that keep track of quit statistics, such as amount of "quit-time," cigarettes not smoked, and money saved.   Many smokers find one or more of the many self-help books available useful in helping them quit and stay off tobacco.  3. LEARN NEW SKILLS AND BEHAVIORS  Try to distract yourself from urges to smoke. Talk to someone, go for a walk, or occupy your time with a task.   When you first try to quit, change your routine. Take a different route to work. Drink tea instead of coffee. Eat breakfast in a different place.   Do something to reduce your stress. Take a hot bath, exercise, or read a book.   Plan something enjoyable to do every day. Reward yourself for not smoking.   Explore interactive web-based programs that specialize in helping you quit.  4. GET MEDICINE AND USE IT CORRECTLY Medicines can help you stop smoking and decrease the urge to smoke. Combining medicine with the above behavioral methods and support can quadruple your chances of successfully quitting smoking. The [place] Food and Drug Administration (FDA) has approved 7 medicines to help you quit smoking. These medicines fall into 3 categories.  Nicotine replacement therapy (delivers nicotine to your body without the negative effects and risks of smoking):   Nicotine gum: Available over-the-counter.   Nicotine lozenges: Available over-the-counter.   Nicotine inhaler: Available by prescription.   Nicotine nasal spray: Available by prescription.   Nicotine skin patches (transdermal): Available by prescription and over-the-counter.   Antidepressant medicine (helps people abstain from smoking, but how this works is unknown):   Bupropion sustained-release (SR) tablets: Available by prescription.   Nicotinic receptor partial agonist (simulates the effect of nicotine in your brain):  Varenicline tartrate tablets:  Available by prescription.   Ask your caregiver for advice about which medicines to use and how to use them. Carefully read the information on the package.   Everyone who is trying to quit may benefit from using a medicine. If you are pregnant or trying to become pregnant, nursing an infant, you are under age 13, or you smoke fewer than 10 cigarettes per day, talk to your caregiver before taking any nicotine replacement medicines.   You should stop using a nicotine replacement product and call your caregiver if you experience nausea, dizziness, weakness, vomiting, fast or irregular heartbeat, mouth problems with the lozenge or gum, or redness or swelling of the skin around the patch that does not go away.   Do not use any other product containing nicotine while using a nicotine replacement product.   Talk to your caregiver before using these products if you have diabetes, heart disease, asthma, stomach ulcers, you had a recent heart attack, you have high blood pressure that is not controlled with medicine, a history of irregular heartbeat, or you have been prescribed medicine to help you quit smoking.  5. BE PREPARED FOR RELAPSE OR DIFFICULT SITUATIONS  Most relapses occur within the first 3 months after quitting. Do not be discouraged if you start smoking again. Remember, most people try several times before they finally quit.   You may have symptoms of withdrawal because your body is used to nicotine. You may crave cigarettes, be irritable, feel very hungry, cough often, get headaches, or have difficulty concentrating.   The withdrawal symptoms are only temporary. They are strongest when you first quit, but they will go away within 10 to 14 days.  Here are some difficult situations to watch for:  Alcohol. Avoid drinking alcohol. Drinking lowers your chances of successfully quitting.   Caffeine. Try to reduce the amount of caffeine you consume. It also lowers your chances of successfully  quitting.   Other smokers. Being around smoking can make you want to smoke. Avoid smokers.   Weight gain. Many smokers will gain weight when they quit, usually less than 10 pounds. Eat a healthy diet and stay active. Do not let weight gain distract you from your main goal, quitting smoking. Some medicines that help you quit smoking may also help delay weight gain. You can always lose the weight gained after you quit.   Bad mood or depression. There are a lot of ways to improve your mood other than smoking.  If you are having problems with any of these situations, talk to your caregiver. SPECIAL SITUATIONS AND CONDITIONS Studies suggest that everyone can quit smoking. Your situation or condition can give you a special reason to quit.  Pregnant women/new mothers: By quitting, you protect your baby's health and your own.   Hospitalized patients: By quitting, you reduce health problems and help healing.   Heart attack patients: By quitting, you reduce your risk of a second heart attack.   Lung, head, and neck cancer patients: By quitting, you reduce your chance of a second cancer.   Parents of children and adolescents: By quitting, you protect your children from illnesses caused by secondhand smoke.  QUESTIONS TO THINK ABOUT Think about the following questions before you try to stop smoking. You may want to talk about your answers with your caregiver.  Why do you want to quit?   If you tried to quit in the past, what helped and what did not?   What will  be the most difficult situations for you after you quit? How will you plan to handle them?   Who can help you through the tough times? Your family? Friends? Caregiver?   What pleasures do you get from smoking? What ways can you still get pleasure if you quit?  Here are some questions to ask your caregiver:  How can you help me to be successful at quitting?   What medicine do you think would be best for me and how should I take it?    What should I do if I need more help?   What is smoking withdrawal like? How can I get information on withdrawal?  Quitting takes hard work and a lot of effort, but you can quit smoking. FOR MORE INFORMATION  Smokefree.gov (http://www.davis-sullivan.com/) provides free, accurate, evidence-based information and professional assistance to help support the immediate and long-term needs of people trying to quit smoking. Document Released: 01/01/2001 Document Revised: 12/27/2010 Document Reviewed: 10/24/2008 [facility] Patient Information 2012 [place], [place].

## 2011-07-23 NOTE — Progress Notes (Signed)
  Subjective:    Patient ID: Anna Valenzuela, female    DOB: November 10, 1955, 56 y.o.   MRN: [id number]  HPI Patient is here today for her annual Pap smear and breast exam. She is normally a patient of Dr. Tivis Ringer. She continues to be unemployed and is in for work. Her son and daughter-in-law, and grandkids recently moved out of her house.  She seems sad and depressed today. She denies suicidal ideation. She does wish to get back into church and tying counseling through them. She had a very traumatic life experience with multiple reasons for posttraumatic stress disorder.   Review of Systems  Constitutional: Negative for fever and chills.  HENT: Negative for congestion, rhinorrhea and trouble swallowing.   Eyes: Negative for visual disturbance.  Respiratory: Negative for shortness of breath and wheezing.   Cardiovascular: Negative for chest pain, palpitations and leg swelling.  Gastrointestinal: Negative for nausea, vomiting and abdominal pain.  Genitourinary: Negative for menstrual problem.  Skin: Negative for rash.  Psychiatric/Behavioral: Positive for dysphoric mood.       Objective:   Physical Exam  Vitals reviewed. Constitutional: She is oriented to person, place, and time. She appears well-developed and well-nourished.  HENT:  Head: Normocephalic and atraumatic.  Eyes: No scleral icterus.  Neck: Neck supple. No thyromegaly present.  Cardiovascular: Normal rate and regular rhythm.   Pulmonary/Chest: Effort normal and breath sounds normal.  Abdominal: Soft. There is no tenderness. There is no guarding.  Genitourinary:       Normal external female genitalia, BUS is normal. Vagina is pink and irrigated, cervix is multiparous and without lesion. Uterus is small anteverted, no adnexal mass or tenderness.  Musculoskeletal: Normal range of motion.  Neurological: She is alert and oriented to person, place, and time.  Psychiatric: She is withdrawn. She exhibits a depressed mood. She  expresses no homicidal and no suicidal ideation.          Assessment & Plan:   1. Screening for malignant neoplasm of the cervix  Cytology - PAP  2. Routine gynecological examination  MM Digital Screening

## 2011-08-27 ENCOUNTER — Ambulatory Visit (HOSPITAL_COMMUNITY)
Admission: RE | Admit: 2011-08-27 | Discharge: 2011-08-27 | Disposition: A | Discharge status: Home / Self Care | Payer: Self-pay | Source: Ambulatory Visit | Attending: Family Medicine | Admitting: Family Medicine

## 2011-08-27 DIAGNOSIS — Z1231 Encounter for screening mammogram for malignant neoplasm of breast: Secondary | ICD-10-CM | POA: Insufficient documentation

## 2011-09-02 ENCOUNTER — Other Ambulatory Visit: Payer: Self-pay | Admitting: Family Medicine

## 2011-09-02 DIAGNOSIS — R928 Other abnormal and inconclusive findings on diagnostic imaging of breast: Secondary | ICD-10-CM

## 2011-09-04 ENCOUNTER — Encounter: Payer: Self-pay | Admitting: *Deleted

## 2011-09-13 ENCOUNTER — Other Ambulatory Visit: Payer: Self-pay

## 2011-09-18 ENCOUNTER — Encounter: Payer: Self-pay | Admitting: Family Medicine

## 2011-09-18 ENCOUNTER — Ambulatory Visit (INDEPENDENT_AMBULATORY_CARE_PROVIDER_SITE_OTHER): Payer: Self-pay | Admitting: Family Medicine

## 2011-09-18 VITALS — BP 110/62 | HR 76 | Temp 98.1°F | Ht 61.5 in | Wt 131.9 lb

## 2011-09-18 DIAGNOSIS — F17200 Nicotine dependence, unspecified, uncomplicated: Secondary | ICD-10-CM

## 2011-09-18 DIAGNOSIS — J329 Chronic sinusitis, unspecified: Secondary | ICD-10-CM

## 2011-09-18 DIAGNOSIS — F329 Major depressive disorder, single episode, unspecified: Secondary | ICD-10-CM

## 2011-09-18 HISTORY — DX: Chronic sinusitis, unspecified: J32.9

## 2011-09-18 MED ORDER — BUPROPION HCL 75 MG PO TABS: 75.0000 mg | ORAL_TABLET | Freq: Two times a day (BID) | ORAL | Status: DC

## 2011-09-18 MED ORDER — AMOXICILLIN-POT CLAVULANATE 875-125 MG PO TABS: 1.0000 | ORAL_TABLET | Freq: Two times a day (BID) | ORAL | Status: AC

## 2011-09-18 MED ORDER — LORAZEPAM 0.5 MG PO TABS: 0.5000 mg | ORAL_TABLET | Freq: Three times a day (TID) | ORAL | Status: DC | PRN

## 2011-09-18 NOTE — Patient Instructions (Addendum)
See me in three weeks The augmentin is for the sinus infection The lorazepam and wellbutrin are for depression/anxiety/PTSD Let me know if you get desperate - any suicidal thoughts.

## 2011-09-24 ENCOUNTER — Telehealth: Payer: Self-pay | Admitting: Family Medicine

## 2011-09-24 NOTE — Telephone Encounter (Signed)
Yes, she has taken augmentin before.  Pharmacy notified.

## 2011-09-24 NOTE — Telephone Encounter (Signed)
Olegario Messier from [facility] called. You Rx'd Augmentin for pt and she has an allergy to ceftin-heart pain. Do you want to change order?? [id number].
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
## 2011-09-25 NOTE — Assessment & Plan Note (Signed)
Strongly recommended quit - both for $ and anxiety.

## 2011-09-25 NOTE — Assessment & Plan Note (Signed)
Start wellbutrin for dual effect on anxiety, depression and smoking cessation.  Refill lorazepam.

## 2011-10-09 ENCOUNTER — Encounter: Payer: Self-pay | Admitting: Family Medicine

## 2011-10-09 ENCOUNTER — Ambulatory Visit (INDEPENDENT_AMBULATORY_CARE_PROVIDER_SITE_OTHER): Payer: Self-pay | Admitting: Family Medicine

## 2011-10-09 VITALS — BP 97/74 | HR 77 | Temp 99.0°F | Ht 61.5 in | Wt 131.1 lb

## 2011-10-09 DIAGNOSIS — M5380 Other specified dorsopathies, site unspecified: Secondary | ICD-10-CM

## 2011-10-09 DIAGNOSIS — M75100 Unspecified rotator cuff tear or rupture of unspecified shoulder, not specified as traumatic: Secondary | ICD-10-CM

## 2011-10-09 DIAGNOSIS — M62830 Muscle spasm of back: Secondary | ICD-10-CM

## 2011-10-09 DIAGNOSIS — F329 Major depressive disorder, single episode, unspecified: Secondary | ICD-10-CM

## 2011-10-09 DIAGNOSIS — M719 Bursopathy, unspecified: Secondary | ICD-10-CM

## 2011-10-09 MED ORDER — TRAZODONE HCL 100 MG PO TABS: 100.0000 mg | ORAL_TABLET | Freq: Every day | ORAL | Status: DC

## 2011-10-09 MED ORDER — GABAPENTIN 100 MG PO CAPS: 100.0000 mg | ORAL_CAPSULE | Freq: Three times a day (TID) | ORAL | Status: DC

## 2011-10-10 DIAGNOSIS — M75100 Unspecified rotator cuff tear or rupture of unspecified shoulder, not specified as traumatic: Secondary | ICD-10-CM | POA: Insufficient documentation

## 2011-10-10 HISTORY — DX: Unspecified rotator cuff tear or rupture of unspecified shoulder, not specified as traumatic: M75.100

## 2011-10-10 NOTE — Assessment & Plan Note (Signed)
Add trazadone.

## 2011-10-10 NOTE — Assessment & Plan Note (Signed)
Exercises:

## 2011-10-10 NOTE — Progress Notes (Signed)
  Subjective:    Patient ID: Anna Valenzuela, female    DOB: 12/24/1955, 56 y.o.   MRN: [id number]  HPI  Life is less chaotic than one month ago.  Still looking for work.  No new complaints.  Did not pick up antidepressant because not covered by [facility]. No SI or HI  Worsening back pain with some radiation (or neuropathy) down both legs Rt>Lt.  Also Rt. Shoulder pain.  No trauma.     Review of Systems     Objective:   Physical ExamLungs clear Cardiac RRR without m or g Rt. Shoulder + for rotator cuff testing.         Assessment & Plan:

## 2011-10-10 NOTE — Patient Instructions (Signed)
Remember the exercises we discussed. The new medication, trazodone,  is for depression and to help you sleep. The other new medicine is for nerve pain in your legs.  It may also help your shoulders.

## 2011-10-10 NOTE — Assessment & Plan Note (Signed)
Add gabapentin for nerve pain.

## 2011-11-24 ENCOUNTER — Other Ambulatory Visit: Payer: Self-pay | Admitting: Family Medicine

## 2011-12-31 ENCOUNTER — Encounter: Payer: Self-pay | Admitting: Family Medicine

## 2011-12-31 ENCOUNTER — Ambulatory Visit (INDEPENDENT_AMBULATORY_CARE_PROVIDER_SITE_OTHER): Payer: No Typology Code available for payment source | Admitting: Family Medicine

## 2011-12-31 VITALS — BP 95/65 | HR 75 | Temp 98.4°F | Ht 61.5 in | Wt 135.0 lb

## 2011-12-31 DIAGNOSIS — H9201 Otalgia, right ear: Secondary | ICD-10-CM | POA: Insufficient documentation

## 2011-12-31 DIAGNOSIS — M5380 Other specified dorsopathies, site unspecified: Secondary | ICD-10-CM

## 2011-12-31 DIAGNOSIS — M79609 Pain in unspecified limb: Secondary | ICD-10-CM

## 2011-12-31 DIAGNOSIS — M62830 Muscle spasm of back: Secondary | ICD-10-CM

## 2011-12-31 DIAGNOSIS — M79601 Pain in right arm: Secondary | ICD-10-CM | POA: Insufficient documentation

## 2011-12-31 DIAGNOSIS — H9209 Otalgia, unspecified ear: Secondary | ICD-10-CM

## 2011-12-31 DIAGNOSIS — M75100 Unspecified rotator cuff tear or rupture of unspecified shoulder, not specified as traumatic: Secondary | ICD-10-CM

## 2011-12-31 DIAGNOSIS — Z23 Encounter for immunization: Secondary | ICD-10-CM

## 2011-12-31 DIAGNOSIS — M719 Bursopathy, unspecified: Secondary | ICD-10-CM

## 2011-12-31 MED ORDER — GABAPENTIN 100 MG PO CAPS: 100.0000 mg | ORAL_CAPSULE | Freq: Three times a day (TID) | ORAL | Status: DC

## 2011-12-31 NOTE — Assessment & Plan Note (Signed)
Patient with right ear pain and no signs of bacterial infection. Possibly related to URI symptoms vs referred pain from shoulder. Plan: advised that patient try pseudoephedrine for congestion. To follow-up if pain symptoms persist.

## 2011-12-31 NOTE — Patient Instructions (Signed)
Nice to meet you today. Sorry you are having pain. I have put in an order for a neck xray. Please go to the radiology department some time this week to get this done. I have sent in a prescription for gabapentin. Someone will call you with a physical therapy appointment as well.

## 2011-12-31 NOTE — Assessment & Plan Note (Signed)
Pain likely due to rotator cuff issue, though there are some elements of neuropathic pain with the numbness and cold burning sensation. Given her history of cervical fusion there is some concern that this could be contributing to her pain. Plan: will order cervical plane films to evaluate for bony lesion. Refer to PT. Patient to start gabapentin as previously prescribed. Prescription was re-written.

## 2011-12-31 NOTE — Progress Notes (Signed)
  Subjective:    Patient ID: Jaxyn Mestas, female    DOB: [birth year], 56 y.o.   MRN: [id number]  HPI Patient is a 56 yo female presenting for pain in right shoulder and pain in right ear.  Right shoulder and arm pain: described as cold burning sensation in lower arm and numbness in hand. Also sharp pain in shoulder associated with movement. Complains of weakness in this arm as well. Had fusion of cervical spine in 1997. Current pain has been there since September. Has tried ibuprofen and robaxin for this without relief. Was prescribed gabapentin at last visit, but did not get this prescription.  Pain in right ear: for one week. States had lots of ear infections as a child. Has some drainage, congestion, and sore throat. Has tried a variety of over the counter medications for this with out relief.    Review of Systems per HPI     Objective:   Physical Exam  Constitutional: She appears well-developed and well-nourished.  HENT:  Head: Normocephalic and atraumatic.  Mouth/Throat: Oropharynx is clear and moist.       Bilateral TMs normal  Musculoskeletal:       5/5 upper extremity strength, 2+ biceps tendon reflexes, full range of motion in right upper extremity, pain elicited in right shoulder with active motion and resistance during strength exam particularly with empty can test and testing of deltoid strength, patient declined spurlings test   BP 95/65  Pulse 75  Temp 98.4 F (36.9 C) (Oral)  Ht 5' 1.5" (1.562 m)  Wt 135 lb (61.236 kg)  BMI 25.10 kg/m2     Assessment & Plan:

## 2012-01-20 ENCOUNTER — Ambulatory Visit: Payer: No Typology Code available for payment source | Attending: Family Medicine | Admitting: Physical Therapy

## 2012-01-20 ENCOUNTER — Ambulatory Visit (HOSPITAL_COMMUNITY)
Admission: RE | Admit: 2012-01-20 | Discharge: 2012-01-20 | Disposition: A | Discharge status: Home / Self Care | Payer: No Typology Code available for payment source | Source: Ambulatory Visit | Attending: Family Medicine | Admitting: Family Medicine

## 2012-01-20 DIAGNOSIS — M47812 Spondylosis without myelopathy or radiculopathy, cervical region: Secondary | ICD-10-CM | POA: Insufficient documentation

## 2012-01-20 DIAGNOSIS — Z981 Arthrodesis status: Secondary | ICD-10-CM | POA: Insufficient documentation

## 2012-01-20 DIAGNOSIS — R209 Unspecified disturbances of skin sensation: Secondary | ICD-10-CM | POA: Insufficient documentation

## 2012-01-20 DIAGNOSIS — M542 Cervicalgia: Secondary | ICD-10-CM | POA: Insufficient documentation

## 2012-01-20 DIAGNOSIS — R293 Abnormal posture: Secondary | ICD-10-CM | POA: Insufficient documentation

## 2012-01-20 DIAGNOSIS — IMO0001 Reserved for inherently not codable concepts without codable children: Secondary | ICD-10-CM | POA: Insufficient documentation

## 2012-01-20 DIAGNOSIS — M25519 Pain in unspecified shoulder: Secondary | ICD-10-CM | POA: Insufficient documentation

## 2012-01-20 DIAGNOSIS — M75100 Unspecified rotator cuff tear or rupture of unspecified shoulder, not specified as traumatic: Secondary | ICD-10-CM

## 2012-01-20 DIAGNOSIS — M79609 Pain in unspecified limb: Secondary | ICD-10-CM | POA: Insufficient documentation

## 2012-01-30 ENCOUNTER — Ambulatory Visit: Payer: No Typology Code available for payment source | Attending: Family Medicine | Admitting: Physical Therapy

## 2012-01-30 ENCOUNTER — Encounter: Payer: Self-pay | Admitting: Family Medicine

## 2012-01-30 DIAGNOSIS — IMO0001 Reserved for inherently not codable concepts without codable children: Secondary | ICD-10-CM | POA: Insufficient documentation

## 2012-01-30 DIAGNOSIS — R293 Abnormal posture: Secondary | ICD-10-CM | POA: Insufficient documentation

## 2012-01-30 DIAGNOSIS — M542 Cervicalgia: Secondary | ICD-10-CM | POA: Insufficient documentation

## 2012-01-30 DIAGNOSIS — M25519 Pain in unspecified shoulder: Secondary | ICD-10-CM | POA: Insufficient documentation

## 2012-02-03 ENCOUNTER — Encounter: Payer: No Typology Code available for payment source | Admitting: Physical Therapy

## 2012-02-04 ENCOUNTER — Encounter: Payer: Self-pay | Admitting: Family Medicine

## 2012-02-06 ENCOUNTER — Ambulatory Visit: Payer: No Typology Code available for payment source | Admitting: Physical Therapy

## 2012-02-07 ENCOUNTER — Ambulatory Visit (INDEPENDENT_AMBULATORY_CARE_PROVIDER_SITE_OTHER): Payer: No Typology Code available for payment source | Admitting: Family Medicine

## 2012-02-07 ENCOUNTER — Encounter: Payer: Self-pay | Admitting: Family Medicine

## 2012-02-07 ENCOUNTER — Other Ambulatory Visit: Payer: Self-pay | Admitting: Family Medicine

## 2012-02-07 VITALS — BP 118/69 | HR 71 | Temp 98.1°F | Wt 132.9 lb

## 2012-02-07 DIAGNOSIS — M479 Spondylosis, unspecified: Secondary | ICD-10-CM

## 2012-02-07 DIAGNOSIS — F329 Major depressive disorder, single episode, unspecified: Secondary | ICD-10-CM

## 2012-02-07 DIAGNOSIS — M67919 Unspecified disorder of synovium and tendon, unspecified shoulder: Secondary | ICD-10-CM

## 2012-02-07 DIAGNOSIS — M75100 Unspecified rotator cuff tear or rupture of unspecified shoulder, not specified as traumatic: Secondary | ICD-10-CM

## 2012-02-07 MED ORDER — TRAZODONE HCL 50 MG PO TABS: 50.0000 mg | ORAL_TABLET | Freq: Every day | ORAL | Status: DC

## 2012-02-07 NOTE — Telephone Encounter (Signed)
Given that patient did not mention sinus problems during the visit and that antibiotic treatment for chronic sinus conditions is of dubious benefit, I will not refill antibiotic unless seen for that problem.

## 2012-02-07 NOTE — Telephone Encounter (Signed)
Patient is calling because she forgot to ask him for a refill on Augmentin for the sinus infection she has and [facility]  She said that the last time she got it filled it was under her married name that she has since had changed after her divorce.

## 2012-02-08 NOTE — Progress Notes (Signed)
  Subjective:    Patient ID: Anna Valenzuela, female    DOB: 05-11-1955, 57 y.o.   MRN: [id number]  HPI  Anna Valenzuela is for follow up of her rt neck and shoulder pain.  I reviewed Dr. Purvis Sheffield note, the c-spine film results and the physical therapists eval and recs.  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  She is s/p fusion of C4/5.    Of course, with Anna Valenzuela, there is always a discussion of her complicated depression and chaotic family life.  Overall, she is in a stable place at this time.    Review of Systems     Objective:   Physical Exam Right shoulder pain with abduction.   Normal sensation and reflexes.       Assessment & Plan:

## 2012-02-08 NOTE — Assessment & Plan Note (Signed)
Emphasized dual nature of pain and that gabapentin is a safe, non addictive treatment for the neuropathic pain.

## 2012-02-08 NOTE — Assessment & Plan Note (Signed)
Stable

## 2012-02-08 NOTE — Assessment & Plan Note (Signed)
Improving with physical therapy.  

## 2012-02-10 NOTE — Telephone Encounter (Signed)
Spoke with patient and informed below. She will try afrin and if that does not help she will call next week to make an appointment

## 2012-02-13 ENCOUNTER — Ambulatory Visit: Payer: No Typology Code available for payment source | Admitting: Physical Therapy

## 2012-02-20 ENCOUNTER — Ambulatory Visit: Payer: No Typology Code available for payment source | Admitting: Physical Therapy

## 2012-02-25 ENCOUNTER — Ambulatory Visit (INDEPENDENT_AMBULATORY_CARE_PROVIDER_SITE_OTHER): Payer: No Typology Code available for payment source | Admitting: Family Medicine

## 2012-02-25 ENCOUNTER — Encounter: Payer: Self-pay | Admitting: Family Medicine

## 2012-02-25 ENCOUNTER — Ambulatory Visit: Payer: No Typology Code available for payment source | Attending: Family Medicine | Admitting: Physical Therapy

## 2012-02-25 VITALS — BP 101/69 | HR 76 | Temp 97.8°F | Ht 61.5 in | Wt 135.0 lb

## 2012-02-25 DIAGNOSIS — M542 Cervicalgia: Secondary | ICD-10-CM | POA: Insufficient documentation

## 2012-02-25 DIAGNOSIS — M25519 Pain in unspecified shoulder: Secondary | ICD-10-CM | POA: Insufficient documentation

## 2012-02-25 DIAGNOSIS — IMO0001 Reserved for inherently not codable concepts without codable children: Secondary | ICD-10-CM | POA: Insufficient documentation

## 2012-02-25 DIAGNOSIS — M546 Pain in thoracic spine: Secondary | ICD-10-CM

## 2012-02-25 DIAGNOSIS — R293 Abnormal posture: Secondary | ICD-10-CM | POA: Insufficient documentation

## 2012-02-25 MED ORDER — ACETAMINOPHEN-CODEINE #3 300-30 MG PO TABS: 1.0000 | ORAL_TABLET | Freq: Four times a day (QID) | ORAL | Status: DC | PRN

## 2012-02-25 NOTE — Progress Notes (Signed)
Subjective:     Patient ID: Anna Valenzuela, female   DOB: [birth year], 57 y.o.   MRN: [id number]  HPI Anna Valenzuela presents today with CC of Back pain.  1) Back pain - Began Saturday (02/22/12).  Patient was stretching in bed and heard a "pop" and developed severe upper back pain. - Located between the shoulder blades - Initially very severe - 10/10.  Currently 5/10 in severity.  Described as sharp and tight.  No radiation.   - Has improved with Tylenol # 3 at home. - ROS: reports decreased ROM secondary to pain.  Also reports associated neck discomfort. Denies recent fall/trauma.  Review of Systems See HPI    Objective:   Physical Exam General: well appearing lady in NAD. Heart: RRR. No murmurs, rubs, or gallops. Lungs: CTAB. No rales, rhonchi, or wheeze. MSK/Back: Patient appears stiff.  Back: good ROM in all directions.  Tender to palpation in the upper thoracic spine.  Muscles feel ropy and tense.     Assessment:         Plan:

## 2012-02-25 NOTE — Patient Instructions (Addendum)
It was nice meeting you today.  You can take Tylenol 3 as prescribed.  I am also giving you a prescription for physical therapy.  Continue to take your muscle relaxer and ibuprofen.  If your symptoms worsen please don't hesitate to return to the clinic.

## 2012-02-25 NOTE — Assessment & Plan Note (Signed)
Likely muscle strain.  Rx given for Tylenol # 3 given acute exacerbation.  Patient also given prescription for PT for further evaluation and treatment.

## 2012-02-26 ENCOUNTER — Encounter: Payer: No Typology Code available for payment source | Admitting: Rehabilitation

## 2012-02-27 ENCOUNTER — Telehealth: Payer: Self-pay | Admitting: Family Medicine

## 2012-02-27 NOTE — Telephone Encounter (Signed)
Patient would like a referral to a chiropractor for back pain. Call with any questions.

## 2012-02-29 NOTE — Telephone Encounter (Signed)
I would be happy to see her if I have any availability.  I know my schedule is pretty full

## 2012-03-02 ENCOUNTER — Ambulatory Visit: Payer: No Typology Code available for payment source | Admitting: Physical Therapy

## 2012-03-02 NOTE — Telephone Encounter (Signed)
Spoke with patient and she will call back and make an appointment when she is able to get out of house. I did offer to make one for her, but she was unsure because of upcoming weather

## 2012-05-19 ENCOUNTER — Other Ambulatory Visit: Payer: Self-pay | Admitting: Family Medicine

## 2012-05-19 DIAGNOSIS — F329 Major depressive disorder, single episode, unspecified: Secondary | ICD-10-CM

## 2012-05-19 DIAGNOSIS — F3289 Other specified depressive episodes: Secondary | ICD-10-CM

## 2012-05-19 MED ORDER — LORAZEPAM 0.5 MG PO TABS: 0.5000 mg | ORAL_TABLET | Freq: Three times a day (TID) | ORAL | Status: DC | PRN

## 2012-05-19 NOTE — Telephone Encounter (Signed)
Refilled via fax request. 

## 2012-06-11 ENCOUNTER — Ambulatory Visit (INDEPENDENT_AMBULATORY_CARE_PROVIDER_SITE_OTHER): Payer: No Typology Code available for payment source | Admitting: Family Medicine

## 2012-06-11 ENCOUNTER — Encounter: Payer: Self-pay | Admitting: Family Medicine

## 2012-06-11 VITALS — BP 90/56 | HR 76 | Temp 98.3°F | Ht 61.5 in | Wt 134.0 lb

## 2012-06-11 DIAGNOSIS — M545 Low back pain: Secondary | ICD-10-CM

## 2012-06-11 DIAGNOSIS — M5380 Other specified dorsopathies, site unspecified: Secondary | ICD-10-CM

## 2012-06-11 DIAGNOSIS — R350 Frequency of micturition: Secondary | ICD-10-CM

## 2012-06-11 DIAGNOSIS — M62830 Muscle spasm of back: Secondary | ICD-10-CM

## 2012-06-11 LAB — POCT URINALYSIS DIPSTICK
Blood, UA: NEGATIVE
Glucose, UA: NEGATIVE
Ketones, UA: NEGATIVE
Protein, UA: NEGATIVE
Spec Grav, UA: 1.015
Urobilinogen, UA: 0.2

## 2012-06-11 MED ORDER — ACETAMINOPHEN-CODEINE #3 300-30 MG PO TABS: 1.0000 | ORAL_TABLET | Freq: Four times a day (QID) | ORAL | Status: DC | PRN

## 2012-06-11 NOTE — Assessment & Plan Note (Signed)
UA without signs of UTI. Encouraged to fully empty bladder despite back pain and continue to drink fluids.

## 2012-06-11 NOTE — Patient Instructions (Addendum)
It was nice to meet you today. I am sorry your back is hurting, but I think it is all from muscle tension.  Continue to take the ibuprofen 800mg  and the muscle relaxer. I have given you Tylenol#3 to use as needed to rest.  Try using ice and stretch your back as much as possible.  If you have a urinary infection, I will give you a call to let you know.  Take care, please come back if your back gets worse or fails to improve with rest.  Waylon Koffler M. [place] Pelto, M.D.

## 2012-06-11 NOTE — Assessment & Plan Note (Signed)
Based on PE, this is less likely a disc problem and more likely muscle strain. Pt appears to have social stressors including having to sleep on couch. She was give Rx for Tylenol #3 and encouraged to keep taking her muscle relaxer and ibuprofen. She should rest and use ice on the site of her pain. If she develops fever, true incontinence, inability to walk or worsening of pain she should return for re-evaluation. F/u with PCP as scheduled.

## 2012-06-11 NOTE — Progress Notes (Signed)
Patient ID: Anna Valenzuela, female   DOB: July 27, 1955, 57 y.o.   MRN: [id number]  [facility] [name], MD Phone: [phone]   Subjective: HPI: Patient is a 57 y.o. female presenting to clinic today for same day visit for back pain.  Location: lumbar spine now going up to mid back and down to hips Quality: "locks up"  Onset: 7 days ago Worse with: lifting left leg, lying down or sitting  Better with: muscle relaxer and ibuprofen  Radiation: can feel in legs, while sitting goes down the right leg Trauma: no known injury, did stand a lot preparing food Best sitting/standing/leaning forward: No Went to chiropractor over the weekend which did not help.  Red Flags Fecal/urinary incontinence: yes, due to urinary urgency/frequency  Numbness/Weakness: yes, weakness of upper legs and ankles Fever/chills/sweats: no  Night pain: yes, but worse because she is sleeping on a friend's couch Unexplained weight loss: no   h/o cancer/immunosuppression: no  IV drug use: no  PMH of osteoporosis or chronic steroid use: yes, osteopenia and states she has had bone spurs  Urinary frequency/urgency. No dysuria. No new sexual contact. No abdominal/bladder pain. No flank pain.  History Reviewed: 1ppd smoker.  ROS: Please see HPI above.  Objective: Office vital signs reviewed. There were no vitals taken for this visit.  Physical Examination:  General: Awake, alert. NAD. Pulm: CTAB, no wheezes Cardio: RRR, no murmurs appreciated Abdomen:+BS, soft, nontender, nondistended. No suprapubic tenderness or CVA tenderness Back: No bony tenderness. TTP of paraspinal muscles and around SI joint, but appears to not be as pertinent with distraction. No deformity, swelling or redness.  Extremities: No edema Neuro: Grossly intact. 2+ patellar reflexes. Normal gait  Assessment: 57 y.o. female with lower back pain  Plan: See Problem List and After Visit Summary

## 2012-07-29 ENCOUNTER — Encounter: Payer: Self-pay | Admitting: Emergency Medicine

## 2012-07-29 ENCOUNTER — Ambulatory Visit (INDEPENDENT_AMBULATORY_CARE_PROVIDER_SITE_OTHER): Payer: No Typology Code available for payment source | Admitting: Emergency Medicine

## 2012-07-29 VITALS — BP 107/69 | HR 76 | Temp 97.7°F | Resp 16 | Ht 61.5 in | Wt 130.6 lb

## 2012-07-29 DIAGNOSIS — J019 Acute sinusitis, unspecified: Secondary | ICD-10-CM

## 2012-07-29 DIAGNOSIS — M479 Spondylosis, unspecified: Secondary | ICD-10-CM

## 2012-07-29 MED ORDER — IBUPROFEN 800 MG PO TABS: 800.0000 mg | ORAL_TABLET | Freq: Three times a day (TID) | ORAL | Status: DC | PRN

## 2012-07-29 MED ORDER — METHOCARBAMOL 750 MG PO TABS: 750.0000 mg | ORAL_TABLET | Freq: Four times a day (QID) | ORAL | Status: DC | PRN

## 2012-07-29 MED ORDER — AMOXICILLIN-POT CLAVULANATE 875-125 MG PO TABS: 1.0000 | ORAL_TABLET | Freq: Two times a day (BID) | ORAL | Status: DC

## 2012-07-29 NOTE — Progress Notes (Signed)
  Subjective:    Patient ID: Anna Valenzuela, female    DOB: [birth year], 56 y.o.   MRN: [id number]  HPI [facility] is here for a SDA for sinus infection.  She reports having nasal congestion and rhinorrhea for the last week.  In the last 2 days it has gotten worse.  She describes green discharge, sinus pressure, and developing chest congestion.  She reports some hot/cold spells overnight, but no fevers.  She tried Afrin yesterday and it caused significant burning in the nose.  She states that Augmentin has worked in the past.   I have reviewed and updated the following as appropriate: allergies and current medications SHx: non smoker   Review of Systems See HPI    Objective:   Physical Exam BP 107/69  Pulse 76  Temp(Src) 97.7 F (36.5 C)  Resp 16  Ht 5' 1.5" (1.562 m)  Wt 130 lb 9.6 oz (59.24 kg)  BMI 24.28 kg/m2  SpO2 99% Gen: alert, cooperative, NAD HEENT: AT/[place], sclera white, nasal turbinates are erythematous and edematous without purulent drainage, MMM, mild pharyngeal erythema without cobblestoning, TMs normal bilaterally, bilateral maxillary tenderness Neck: supple, no LAD CV: RRR, no murmurs Pulm: CTAB, no wheezes or rales      Assessment & Plan:

## 2012-07-29 NOTE — Patient Instructions (Addendum)
It was nice to meet you! Please take Augmentin 1 pill twice a day for 10 days. Get saline nasal spray and use that 2-3 times a day. If you develop fevers or chills or are just not getting better in the next week, come back. Follow up with Dr. Leveda Anna for medication refills and a physical in the next month or so.

## 2012-07-29 NOTE — Assessment & Plan Note (Signed)
Time course and exam concerning for acute sinusitis. Will treat with Augmentin BID x10 days. Recommended nasal saline spray 2-3 times a day. Return precautions given.  Follow up in 1 week if not improving.

## 2012-07-29 NOTE — Progress Notes (Signed)
Patient complains of having sinus pressure Congestion and cough for 1 week Eyes red and watery

## 2012-09-30 ENCOUNTER — Encounter: Payer: Self-pay | Admitting: Family Medicine

## 2012-09-30 ENCOUNTER — Ambulatory Visit (INDEPENDENT_AMBULATORY_CARE_PROVIDER_SITE_OTHER): Payer: No Typology Code available for payment source | Admitting: Family Medicine

## 2012-09-30 VITALS — BP 101/73 | HR 73 | Temp 98.8°F | Ht 61.5 in | Wt 133.4 lb

## 2012-09-30 DIAGNOSIS — M5380 Other specified dorsopathies, site unspecified: Secondary | ICD-10-CM

## 2012-09-30 DIAGNOSIS — E0500 Thyrotoxicosis with diffuse goiter without thyrotoxic crisis or storm: Secondary | ICD-10-CM

## 2012-09-30 DIAGNOSIS — E7800 Pure hypercholesterolemia, unspecified: Secondary | ICD-10-CM

## 2012-09-30 DIAGNOSIS — M62830 Muscle spasm of back: Secondary | ICD-10-CM

## 2012-09-30 DIAGNOSIS — F329 Major depressive disorder, single episode, unspecified: Secondary | ICD-10-CM

## 2012-09-30 DIAGNOSIS — M479 Spondylosis, unspecified: Secondary | ICD-10-CM

## 2012-09-30 DIAGNOSIS — F17200 Nicotine dependence, unspecified, uncomplicated: Secondary | ICD-10-CM

## 2012-09-30 MED ORDER — GABAPENTIN 100 MG PO CAPS: 100.0000 mg | ORAL_CAPSULE | Freq: Three times a day (TID) | ORAL | Status: DC

## 2012-09-30 MED ORDER — IBUPROFEN 800 MG PO TABS: 800.0000 mg | ORAL_TABLET | Freq: Three times a day (TID) | ORAL | Status: DC | PRN

## 2012-09-30 MED ORDER — METHOCARBAMOL 750 MG PO TABS: 750.0000 mg | ORAL_TABLET | Freq: Four times a day (QID) | ORAL | Status: DC | PRN

## 2012-09-30 NOTE — Assessment & Plan Note (Signed)
Low back and c spine disease.  Stable on meds.

## 2012-10-01 NOTE — Assessment & Plan Note (Signed)
Stable, refill meds

## 2012-10-01 NOTE — Assessment & Plan Note (Signed)
No goiter and no symptoms.

## 2012-10-01 NOTE — Patient Instructions (Signed)
I refilled all your meds.  See me in three months and we will do blood work.

## 2012-10-01 NOTE — Assessment & Plan Note (Signed)
"  It is my only vice."  Advised to quit but not interested.

## 2012-10-01 NOTE — Progress Notes (Signed)
  Subjective:    Patient ID: Anna Valenzuela, female    DOB: March 28, 1955, 57 y.o.   MRN: [id number]  HPI  Patient primarily comes in for refills of her medications.  Chaos is less in her life.  She now has a semi stable living situation.  Not abusive.  No relationship.  No income, no job.  Daughter is doing well.  Son is dysfunctional and in the middle of a divorce.   Back pain i s stable on current meds. Anxiety/PTSD is at her normal baseline. Not asking for a change in any meds.    Review of Systems     Objective:   Physical ExamLungs clear Cardiac RRR without m or g Lumbar paraspinous muscle tenderness.        Assessment & Plan:

## 2012-10-01 NOTE — Assessment & Plan Note (Signed)
Chronic lumbar and cervical problems.  OK at present on meds.

## 2012-10-02 ENCOUNTER — Other Ambulatory Visit: Payer: No Typology Code available for payment source

## 2012-10-15 LAB — HM MAMMOGRAPHY

## 2012-11-04 ENCOUNTER — Other Ambulatory Visit (INDEPENDENT_AMBULATORY_CARE_PROVIDER_SITE_OTHER): Payer: No Typology Code available for payment source

## 2012-11-04 DIAGNOSIS — E0500 Thyrotoxicosis with diffuse goiter without thyrotoxic crisis or storm: Secondary | ICD-10-CM

## 2012-11-04 DIAGNOSIS — E7800 Pure hypercholesterolemia, unspecified: Secondary | ICD-10-CM

## 2012-11-04 DIAGNOSIS — Z23 Encounter for immunization: Secondary | ICD-10-CM

## 2012-11-04 LAB — COMPREHENSIVE METABOLIC PANEL
ALT: 8 U/L (ref 0–35)
CO2: 28 mEq/L (ref 19–32)
Calcium: 9.8 mg/dL (ref 8.4–10.5)
Chloride: 106 mEq/L (ref 96–112)
Creat: 0.71 mg/dL (ref 0.50–1.10)
Sodium: 139 mEq/L (ref 135–145)
Total Protein: 7.1 g/dL (ref 6.0–8.3)

## 2012-11-04 LAB — TSH: TSH: 1.126 u[IU]/mL (ref 0.350–4.500)

## 2012-11-04 LAB — LIPID PANEL
Cholesterol: 212 mg/dL — ABNORMAL HIGH (ref 0–200)
LDL Cholesterol: 143 mg/dL — ABNORMAL HIGH (ref 0–99)
Triglycerides: 59 mg/dL (ref <150)

## 2012-11-04 NOTE — Progress Notes (Signed)
TSH, LIPID, CMP drawn

## 2012-11-05 ENCOUNTER — Encounter: Payer: Self-pay | Admitting: Family Medicine

## 2012-11-05 DIAGNOSIS — E7800 Pure hypercholesterolemia, unspecified: Secondary | ICD-10-CM

## 2012-11-05 HISTORY — DX: Pure hypercholesterolemia, unspecified: E78.00

## 2012-11-05 NOTE — Progress Notes (Signed)
  Subjective:    Patient ID: Anna Valenzuela, female    DOB: 1955/05/09, 57 y.o.   MRN: [id number]  HPI Labs OK except high LDL.  10 y risk of CAD is 3.4% so not a candidate for statin at this point.  Will follow.    Review of Systems     Objective:   Physical Exam        Assessment & Plan:

## 2012-11-18 ENCOUNTER — Ambulatory Visit: Payer: Self-pay

## 2012-12-01 ENCOUNTER — Ambulatory Visit: Payer: No Typology Code available for payment source

## 2012-12-01 ENCOUNTER — Other Ambulatory Visit: Payer: Self-pay | Admitting: Family Medicine

## 2013-04-07 NOTE — Progress Notes (Signed)
A user error has taken place: encounter opened in error, closed for administrative reasons.

## 2013-05-28 ENCOUNTER — Telehealth: Payer: Self-pay | Admitting: Family Medicine

## 2013-05-28 NOTE — Telephone Encounter (Signed)
Patient's son is currently incarcerated and patient is wanting to visit him. She has a titanium plate in her neck and patient is requesting a letter from Dr. Andria Frames explaining this. Please include date that plate was placed in patient's neck. Please address to [facility]. Call patient for pick up.

## 2013-05-31 NOTE — Telephone Encounter (Signed)
Letter done, please see communications.

## 2013-05-31 NOTE — Telephone Encounter (Signed)
Patient states that plate was placed in in 1997, she does not remember what month.

## 2013-05-31 NOTE — Telephone Encounter (Signed)
I am happy to provide such a letter.  We have x rays from 12/2011 showing plate in place.  I cannot find in her current electronic chart when the plate was actually placed.  Called and left message with patient to call us back with the date/year she had the surgery.

## 2013-06-04 ENCOUNTER — Other Ambulatory Visit: Payer: Self-pay | Admitting: Family Medicine

## 2013-06-04 ENCOUNTER — Ambulatory Visit (INDEPENDENT_AMBULATORY_CARE_PROVIDER_SITE_OTHER): Payer: Self-pay | Admitting: Family Medicine

## 2013-06-04 ENCOUNTER — Ambulatory Visit
Admission: RE | Admit: 2013-06-04 | Discharge: 2013-06-04 | Disposition: A | Discharge status: Home / Self Care | Payer: Self-pay | Source: Ambulatory Visit | Attending: Family Medicine | Admitting: Family Medicine

## 2013-06-04 ENCOUNTER — Encounter: Payer: Self-pay | Admitting: Family Medicine

## 2013-06-04 VITALS — BP 107/65 | HR 76 | Temp 98.3°F | Ht 61.5 in | Wt 135.7 lb

## 2013-06-04 DIAGNOSIS — S8990XA Unspecified injury of unspecified lower leg, initial encounter: Secondary | ICD-10-CM

## 2013-06-04 DIAGNOSIS — S99921A Unspecified injury of right foot, initial encounter: Secondary | ICD-10-CM

## 2013-06-04 DIAGNOSIS — S99929A Unspecified injury of unspecified foot, initial encounter: Secondary | ICD-10-CM

## 2013-06-04 DIAGNOSIS — S99919A Unspecified injury of unspecified ankle, initial encounter: Secondary | ICD-10-CM

## 2013-06-04 NOTE — Patient Instructions (Signed)
Anna Valenzuela, it was a pleasure seeing you today. Today we talked about your foot injury. Please continue taking your ibuprofen as needed for pain. I have ordered an x-ray. Please get the x-ray. I will call you with the results. In the mean time, I'll wrap your foot.  Please make an appointment for follow-up in 2 weeks unless your pain improves. If it does, please schedule an appointment to see Dr. Andria Frames at your regular appointment time.  If you have any questions or concerns, please do not hesitate to call the office at [phone].  Sincerely,  Cordelia Poche, MD

## 2013-06-05 DIAGNOSIS — S99922A Unspecified injury of left foot, initial encounter: Secondary | ICD-10-CM | POA: Insufficient documentation

## 2013-06-05 NOTE — Assessment & Plan Note (Addendum)
Most likely sprain. Has some point tenderness. Will obtain x-ray to rule out fracture especially since patient has a history of osteoporosis. Will wrap in ACE wrap.

## 2013-06-05 NOTE — Progress Notes (Signed)
   Subjective:    Patient ID: Anna Valenzuela, female    DOB: 11-09-1955, 58 y.o.   MRN: [id number]  HPI  Patient presents to clinic with left foot pain after an injury last night. Patient was walking down the stairs when she tripped. She caught herself before falling but hit her foot on the stair. Her pain is constant and throbbing and improved with ibuprofen. Walking aggravates pain.  Past Medical History  Diagnosis Date  . Osteoporosis   . Thyroid disease    Review of Systems  Cardiovascular: Positive for leg swelling (minimal foot swelling).  Musculoskeletal: Positive for gait problem (pain).       Objective:   Physical Exam  Musculoskeletal:       Right ankle: Normal.       Left ankle: She exhibits normal range of motion, no swelling and no deformity. No tenderness.       Right foot: Normal.       Left foot: She exhibits tenderness (over 4th metatarsal) and swelling (minimal). She exhibits normal range of motion, no crepitus and no deformity.       Assessment & Plan:

## 2013-06-07 ENCOUNTER — Telehealth: Payer: Self-pay | Admitting: *Deleted

## 2013-06-07 NOTE — Telephone Encounter (Signed)
Message copied by Corinna Capra on Mon Jun 07, 2013  9:04 AM ------      Message from: Mariel Aloe      Created: Sat Jun 05, 2013  7:39 AM       Please let patient know her x-ray shows no fracture. Thanks! ------

## 2013-06-07 NOTE — Telephone Encounter (Signed)
Relayed message,patient voiced understanding.[place]

## 2013-06-16 ENCOUNTER — Ambulatory Visit: Payer: Self-pay

## 2013-06-23 ENCOUNTER — Other Ambulatory Visit: Payer: Self-pay | Admitting: Family Medicine

## 2013-06-23 MED ORDER — LORAZEPAM 0.5 MG PO TABS: ORAL_TABLET | ORAL | Status: DC

## 2013-06-23 NOTE — Progress Notes (Signed)
Two issues: Received request from dentist to order sleep study.[facility] [phone])  Called patient:  She is overdue for a checkup.  She will schedule appointment and we will discuss further.  Also receive refill request on lorazepam via fax.

## 2013-08-05 MED ORDER — SERTRALINE 25 MG TAB
25 mg | ORAL_TABLET | Freq: Every day | ORAL | Status: AC
Start: 2013-08-05 — End: ?

## 2013-08-05 NOTE — Patient Instructions (Signed)
Continue alprazolam at night as before    Begin sertraline 25 mg one tablet at bedtime. Stay on this dose for 7-10 days, then, if needed, increase to 2 tablet at bedtime.  You may increase further to 3 at night if still not helpful enough by the 15-20th days

## 2013-08-05 NOTE — Progress Notes (Signed)
HISTORY OF PRESENT ILLNESS  Madeline PerchesDonna K Wilson is a 58 y.o. female.  Chief Complaint   Patient presents with   ??? Stress       HPI  For the last 6-8 months, Madeline Wilson has been feeling "stressed" and has frequent crying spells.  After her bladder cancer diagnosis, she feels worried.   Dr. Kym GroomWhatley, urologist and Dr. Laureen Abrahamsoster, hematology-oncology manage her cancer  Dr. Kym GroomWhatley recently prescribed alprazolam 0.25 (1/2) at night and this helps her sleep. She's found that it makes her drowsy during the day and she doesn't take it in the mornings.  Review of Systems   Constitutional: Negative for fever, chills, weight loss, malaise/fatigue and diaphoresis.   HENT: Negative for ear discharge, ear pain, hearing loss and nosebleeds.    Eyes: Negative for blurred vision, double vision, pain and discharge.   Respiratory: Negative for cough, hemoptysis, sputum production, shortness of breath and wheezing.    Cardiovascular: Negative for chest pain, palpitations, orthopnea, claudication and leg swelling.   Gastrointestinal: Negative for heartburn, nausea, abdominal pain, blood in stool and melena.   Genitourinary: Negative for dysuria, frequency, hematuria and flank pain.   Musculoskeletal: Negative for back pain and neck pain.   Skin: Negative for itching and rash.   Neurological: Negative for dizziness, tremors, weakness and headaches.   Endo/Heme/Allergies: Does not bruise/bleed easily.   Psychiatric/Behavioral: Positive for depression. Negative for memory loss and substance abuse. The patient is nervous/anxious and has insomnia.      BP 134/86 mmHg   Pulse 81    Physical Exam   Constitutional: She appears well-developed and well-nourished.   Skin: Skin is warm and dry. No rash noted.   Psychiatric: Her speech is normal and behavior is normal. Thought content normal. Her mood appears anxious. Her affect is not angry, not blunt, not labile and not inappropriate. Cognition and memory are normal. She exhibits a depressed mood.    Nursing note and vitals reviewed.      ASSESSMENT and PLAN  Madeline Wilson was seen today for anxiety and worry symptoms associated with the uncertainty of her future medical problems      Adjustment disorder with anxious mood  - sertraline (ZOLOFT) 25 mg tablet; Take 1 Tab by mouth daily. 1 or 2 tablets at night to help with stress symptoms  Indications: ANXIETY WITH DEPRESSION        reviewed medications and side effects in detail

## 2013-09-13 NOTE — Progress Notes (Signed)
Allergy injection administered

## 2013-10-20 NOTE — Progress Notes (Signed)
Patient is here for allergen immunotherapy.  Shot record attached with this documentation.     Patient did not experience any side effects.

## 2013-11-01 ENCOUNTER — Encounter: Admit: 2013-11-01 | Discharge: 2013-11-01 | Payer: BLUE CROSS/BLUE SHIELD | Primary: Family Medicine

## 2013-11-15 ENCOUNTER — Institutional Professional Consult (permissible substitution): Admit: 2013-11-15 | Discharge: 2013-11-15 | Payer: BLUE CROSS/BLUE SHIELD | Primary: Family Medicine

## 2013-11-15 ENCOUNTER — Other Ambulatory Visit: Payer: Self-pay | Admitting: *Deleted

## 2013-11-15 ENCOUNTER — Other Ambulatory Visit: Payer: Self-pay | Admitting: Family Medicine

## 2013-11-15 DIAGNOSIS — Z23 Encounter for immunization: Secondary | ICD-10-CM

## 2013-11-15 DIAGNOSIS — M8580 Other specified disorders of bone density and structure, unspecified site: Secondary | ICD-10-CM

## 2013-11-15 DIAGNOSIS — M479 Spondylosis, unspecified: Secondary | ICD-10-CM

## 2013-11-15 MED ORDER — METHOCARBAMOL 750 MG PO TABS: 750.0000 mg | ORAL_TABLET | Freq: Four times a day (QID) | ORAL | Status: DC | PRN

## 2013-11-15 MED ORDER — IBUPROFEN 800 MG PO TABS: 800.0000 mg | ORAL_TABLET | Freq: Three times a day (TID) | ORAL | Status: DC | PRN

## 2013-11-16 ENCOUNTER — Other Ambulatory Visit: Payer: Self-pay | Admitting: Family Medicine

## 2013-11-16 NOTE — Telephone Encounter (Signed)
Encounter created in error

## 2013-11-18 ENCOUNTER — Encounter: Admit: 2013-11-18 | Discharge: 2013-11-18 | Payer: BLUE CROSS/BLUE SHIELD | Primary: Family Medicine

## 2013-11-22 ENCOUNTER — Other Ambulatory Visit: Payer: Self-pay | Admitting: Family Medicine

## 2013-11-22 DIAGNOSIS — M4722 Other spondylosis with radiculopathy, cervical region: Secondary | ICD-10-CM

## 2013-11-23 NOTE — Assessment & Plan Note (Signed)
refill 

## 2013-12-08 ENCOUNTER — Institutional Professional Consult (permissible substitution): Admit: 2013-12-08 | Discharge: 2013-12-08 | Payer: BLUE CROSS/BLUE SHIELD | Primary: Family Medicine

## 2013-12-08 DIAGNOSIS — J301 Allergic rhinitis due to pollen: Secondary | ICD-10-CM

## 2013-12-08 NOTE — Progress Notes (Signed)
Patient is here for allergy injections, see scanned allergy inj. Documentation pt observed for 5 mins. No reaction or side effects noted

## 2013-12-10 ENCOUNTER — Encounter: Payer: Self-pay | Admitting: Family Medicine

## 2013-12-10 ENCOUNTER — Ambulatory Visit (INDEPENDENT_AMBULATORY_CARE_PROVIDER_SITE_OTHER): Payer: No Typology Code available for payment source | Admitting: *Deleted

## 2013-12-10 ENCOUNTER — Ambulatory Visit (INDEPENDENT_AMBULATORY_CARE_PROVIDER_SITE_OTHER): Payer: Self-pay | Admitting: Family Medicine

## 2013-12-10 VITALS — BP 119/79 | HR 95 | Temp 98.8°F | Ht 62.0 in | Wt 139.0 lb

## 2013-12-10 DIAGNOSIS — E7800 Pure hypercholesterolemia, unspecified: Secondary | ICD-10-CM

## 2013-12-10 DIAGNOSIS — Z8639 Personal history of other endocrine, nutritional and metabolic disease: Secondary | ICD-10-CM

## 2013-12-10 DIAGNOSIS — K635 Polyp of colon: Secondary | ICD-10-CM

## 2013-12-10 DIAGNOSIS — M4722 Other spondylosis with radiculopathy, cervical region: Secondary | ICD-10-CM

## 2013-12-10 DIAGNOSIS — Z23 Encounter for immunization: Secondary | ICD-10-CM

## 2013-12-10 MED ORDER — METHOCARBAMOL 750 MG PO TABS: 750.0000 mg | ORAL_TABLET | Freq: Four times a day (QID) | ORAL | Status: DC | PRN

## 2013-12-10 MED ORDER — IBUPROFEN 800 MG PO TABS: 800.0000 mg | ORAL_TABLET | Freq: Three times a day (TID) | ORAL | Status: DC | PRN

## 2013-12-10 MED ORDER — LORAZEPAM 0.5 MG PO TABS: ORAL_TABLET | ORAL | Status: DC

## 2013-12-10 MED ORDER — GABAPENTIN 100 MG PO CAPS: 100.0000 mg | ORAL_CAPSULE | Freq: Three times a day (TID) | ORAL | Status: DC

## 2013-12-10 NOTE — Assessment & Plan Note (Signed)
Recheck labs and recalculate risk

## 2013-12-10 NOTE — Patient Instructions (Signed)
I refilled all your medications. You will get a flu shot today. You are overdue for both a mammogram and a colonoscopy.  Get on it. I will call with blood work results

## 2013-12-10 NOTE — Assessment & Plan Note (Signed)
Check TSH 

## 2013-12-10 NOTE — Progress Notes (Signed)
   Subjective:    Patient ID: Anna Valenzuela, female    DOB: [birth year], 58 y.o.   MRN: [id number]  HPI Recheck of chronic problems and refill meds.   Psychosocial: OK - chronically unstable.  She is grandchild sitting on a regular basis and that does bring her joy. Needs refills on her meds - done Tired- history of Graves so at risk for hypothyroid. Needs mammo She is overdue for colonoscopy for FU of polyps.  I made her aware of the importance.    Review of Systems     Objective:   Physical Exam Lungs clear Cardiac RRR without m or g       Assessment & Plan:

## 2013-12-10 NOTE — Assessment & Plan Note (Signed)
Schedule FU colonoscopy

## 2013-12-11 LAB — COMPREHENSIVE METABOLIC PANEL
ALK PHOS: 47 U/L (ref 39–117)
ALT: 18 U/L (ref 0–35)
AST: 21 U/L (ref 0–37)
Albumin: 4.4 g/dL (ref 3.5–5.2)
BILIRUBIN TOTAL: 0.4 mg/dL (ref 0.2–1.2)
BUN: 9 mg/dL (ref 6–23)
CALCIUM: 9 mg/dL (ref 8.4–10.5)
CHLORIDE: 105 meq/L (ref 96–112)
CO2: 25 mEq/L (ref 19–32)
CREATININE: 0.68 mg/dL (ref 0.50–1.10)
Glucose, Bld: 79 mg/dL (ref 70–99)
Potassium: 4.1 mEq/L (ref 3.5–5.3)
Sodium: 141 mEq/L (ref 135–145)
Total Protein: 6.7 g/dL (ref 6.0–8.3)

## 2013-12-11 LAB — LIPID PANEL
CHOL/HDL RATIO: 3.7 ratio
Cholesterol: 190 mg/dL (ref 0–200)
HDL: 52 mg/dL (ref >39)
LDL CALC: 124 mg/dL — AB (ref 0–99)
TRIGLYCERIDES: 72 mg/dL (ref <150)
VLDL: 14 mg/dL (ref 0–40)

## 2013-12-11 LAB — TSH: TSH: 2.078 u[IU]/mL (ref 0.350–4.500)

## 2013-12-23 ENCOUNTER — Ambulatory Visit: Payer: Self-pay

## 2013-12-31 ENCOUNTER — Encounter: Admit: 2013-12-31 | Discharge: 2013-12-31 | Payer: BLUE CROSS/BLUE SHIELD | Primary: Family Medicine

## 2014-01-03 ENCOUNTER — Institutional Professional Consult (permissible substitution): Admit: 2014-01-03 | Discharge: 2014-01-03 | Payer: BLUE CROSS/BLUE SHIELD | Primary: Family Medicine

## 2014-01-03 DIAGNOSIS — Z23 Encounter for immunization: Secondary | ICD-10-CM

## 2014-01-03 NOTE — Progress Notes (Signed)
Madeline Wilson comes in requesting a tetanus/pertussis vaccine due to a new baby in the family.

## 2014-01-09 ENCOUNTER — Encounter (HOSPITAL_COMMUNITY): Payer: Self-pay

## 2014-01-09 ENCOUNTER — Emergency Department (HOSPITAL_COMMUNITY)
Admission: EM | Admit: 2014-01-09 | Discharge: 2014-01-09 | Disposition: A | Discharge status: Home / Self Care | Payer: Self-pay | Source: Home / Self Care | Attending: Emergency Medicine | Admitting: Emergency Medicine

## 2014-01-09 DIAGNOSIS — J069 Acute upper respiratory infection, unspecified: Secondary | ICD-10-CM

## 2014-01-09 MED ORDER — GUAIFENESIN-CODEINE 100-10 MG/5ML PO SOLN: 10.0000 mL | ORAL | Status: DC | PRN

## 2014-01-09 MED ORDER — AZITHROMYCIN 250 MG PO TABS: ORAL_TABLET | ORAL | Status: DC

## 2014-01-09 NOTE — Discharge Instructions (Signed)
Upper Respiratory Infection, Adult An upper respiratory infection (URI) is also sometimes known as the common cold. The upper respiratory tract includes the nose, sinuses, throat, trachea, and bronchi. Bronchi are the airways leading to the lungs. Most people improve within 1 week, but symptoms can last up to 2 weeks. A residual cough may last even longer.  CAUSES Many different viruses can infect the tissues lining the upper respiratory tract. The tissues become irritated and inflamed and often become very moist. Mucus production is also common. A cold is contagious. You can easily spread the virus to others by oral contact. This includes kissing, sharing a glass, coughing, or sneezing. Touching your mouth or nose and then touching a surface, which is then touched by another person, can also spread the virus. SYMPTOMS  Symptoms typically develop 1 to 3 days after you come in contact with a cold virus. Symptoms vary from person to person. They may include:  Runny nose.  Sneezing.  Nasal congestion.  Sinus irritation.  Sore throat.  Loss of voice (laryngitis).  Cough.  Fatigue.  Muscle aches.  Loss of appetite.  Headache.  Low-grade fever. DIAGNOSIS  You might diagnose your own cold based on familiar symptoms, since most people get a cold 2 to 3 times a year. Your caregiver can confirm this based on your exam. Most importantly, your caregiver can check that your symptoms are not due to another disease such as strep throat, sinusitis, pneumonia, asthma, or epiglottitis. Blood tests, throat tests, and X-rays are not necessary to diagnose a common cold, but they may sometimes be helpful in excluding other more serious diseases. Your caregiver will decide if any further tests are required. RISKS AND COMPLICATIONS  You may be at risk for a more severe case of the common cold if you smoke cigarettes, have chronic heart disease (such as heart failure) or lung disease (such as asthma), or if  you have a weakened immune system. The very young and very old are also at risk for more serious infections. Bacterial sinusitis, middle ear infections, and bacterial pneumonia can complicate the common cold. The common cold can worsen asthma and chronic obstructive pulmonary disease (COPD). Sometimes, these complications can require emergency medical care and may be life-threatening. PREVENTION  The best way to protect against getting a cold is to practice good hygiene. Avoid oral or hand contact with people with cold symptoms. Wash your hands often if contact occurs. There is no clear evidence that vitamin C, vitamin E, echinacea, or exercise reduces the chance of developing a cold. However, it is always recommended to get plenty of rest and practice good nutrition. TREATMENT  Treatment is directed at relieving symptoms. There is no cure. Antibiotics are not effective, because the infection is caused by a virus, not by bacteria. Treatment may include:  Increased fluid intake. Sports drinks offer valuable electrolytes, sugars, and fluids.  Breathing heated mist or steam (vaporizer or shower).  Eating chicken soup or other clear broths, and maintaining good nutrition.  Getting plenty of rest.  Using gargles or lozenges for comfort.  Controlling fevers with ibuprofen or acetaminophen as directed by your caregiver.  Increasing usage of your inhaler if you have asthma. Zinc gel and zinc lozenges, taken in the first 24 hours of the common cold, can shorten the duration and lessen the severity of symptoms. Pain medicines may help with fever, muscle aches, and throat pain. A variety of non-prescription medicines are available to treat congestion and runny nose. Your caregiver   can make recommendations and may suggest nasal or lung inhalers for other symptoms.  HOME CARE INSTRUCTIONS   Only take over-the-counter or prescription medicines for pain, discomfort, or fever as directed by your  caregiver.  Use a warm mist humidifier or inhale steam from a shower to increase air moisture. This may keep secretions moist and make it easier to breathe.  Drink enough water and fluids to keep your urine clear or pale yellow.  Rest as needed.  Return to work when your temperature has returned to normal or as your caregiver advises. You may need to stay home longer to avoid infecting others. You can also use a face mask and careful hand washing to prevent spread of the virus. SEEK MEDICAL CARE IF:   After the first few days, you feel you are getting worse rather than better.  You need your caregiver's advice about medicines to control symptoms.  You develop chills, worsening shortness of breath, or brown or red sputum. These may be signs of pneumonia.  You develop yellow or brown nasal discharge or pain in the face, especially when you bend forward. These may be signs of sinusitis.  You develop a fever, swollen neck glands, pain with swallowing, or white areas in the back of your throat. These may be signs of strep throat. SEEK IMMEDIATE MEDICAL CARE IF:   You have a fever.  You develop severe or persistent headache, ear pain, sinus pain, or chest pain.  You develop wheezing, a prolonged cough, cough up blood, or have a change in your usual mucus (if you have chronic lung disease).  You develop sore muscles or a stiff neck. Document Released: 07/03/2000 Document Revised: 04/01/2011 Document Reviewed: 04/14/2013 [employer] Patient Information 2015 [employer]. This information is not intended to replace advice given to you by your health care provider. Make sure you discuss any questions you have with your health care provider.  

## 2014-01-09 NOTE — ED Provider Notes (Signed)
CSN: [id number]     Arrival date & time 01/09/14  1222 History   First MD Initiated Contact with Patient 01/09/14 1256     Chief Complaint  Patient presents with  . URI   (Consider location/radiation/quality/duration/timing/severity/associated sxs/prior Treatment) HPI        58 year old female presents complaining of being sick for one week. She has cough, congestion, chest pain with coughing, mild shortness of breath, sinus pressure, sore throat. Symptoms have been gradually worsening for the entire week. She is taking over-the-counter medications without relief. She also has nausea without vomiting. She denies fever, chills, vomiting, diarrhea or recent travel or sick contacts. No history of pneumonia.   Past Medical History  Diagnosis Date  . Osteoporosis   . Thyroid disease    Past Surgical History  Procedure Laterality Date  . Spine surgery  1997    Cervical fusion  . Cesarean section    . Hemorrhoid surgery     Family History  Problem Relation Age of Onset  . Alcohol abuse Father   . Hypertension Father   . Kidney disease Sister   . Mental illness Son     schizophrenia   History  Substance Use Topics  . Smoking status: Current Every Day Smoker -- 1.00 packs/day    Types: Cigarettes  . Smokeless tobacco: Never Used  . Alcohol Use: 0.5 oz/week    1 drink(s) per week   OB History    No data available     Review of Systems  Constitutional: Positive for fatigue. Negative for fever and chills.  HENT: Positive for congestion, sinus pressure and sore throat. Negative for ear pain.   Eyes: Negative for visual disturbance.  Respiratory: Positive for cough, chest tightness and shortness of breath. Negative for wheezing.   Cardiovascular: Negative for chest pain.  Gastrointestinal: Positive for nausea. Negative for vomiting, abdominal pain and diarrhea.  Musculoskeletal: Positive for myalgias.  All other systems reviewed and are negative.   Allergies  Sulfa  antibiotics and Ceftin  Home Medications   Prior to Admission medications   Medication Sig Start Date End Date Taking? Authorizing Provider  aspirin 81 MG tablet Take 81 mg by mouth daily.      Historical Provider, MD  azithromycin (ZITHROMAX Z-PAK) 250 MG tablet Use as directed 01/09/14   Liam Graham, PA-C  gabapentin (NEURONTIN) 100 MG capsule Take 1 capsule (100 mg total) by mouth 3 (three) times daily. 12/10/13   Zigmund Gottron, MD  guaiFENesin-codeine 100-10 MG/5ML syrup Take 10 mLs by mouth every 4 (four) hours as needed for cough. 01/09/14   Liam Graham, PA-C  ibuprofen (ADVIL,MOTRIN) 800 MG tablet Take 1 tablet (800 mg total) by mouth every 8 (eight) hours as needed. 12/10/13   Zigmund Gottron, MD  LORazepam (ATIVAN) 0.5 MG tablet TAKE 1 TABLET BY MOUTH EVERY 8 HOURS AS NEEDED FOR ANXIETY OR MUSCLE SPASM 12/10/13   Zigmund Gottron, MD  methocarbamol (ROBAXIN) 750 MG tablet Take 1 tablet (750 mg total) by mouth 4 (four) times daily as needed. 12/10/13   Zigmund Gottron, MD   BP 102/78 mmHg  Pulse 90  Temp(Src) 98.3 F (36.8 C) (Oral)  Resp 16  SpO2 97% Physical Exam  Constitutional: She is oriented to person, place, and time. Vital signs are normal. She appears well-developed and well-nourished. No distress.  HENT:  Head: Normocephalic and atraumatic.  Right Ear: External ear normal.  Left Ear: External ear normal.  Nose: Right  sinus exhibits maxillary sinus tenderness. Right sinus exhibits no frontal sinus tenderness. Left sinus exhibits maxillary sinus tenderness. Left sinus exhibits no frontal sinus tenderness.  Mouth/Throat: Oropharynx is clear and moist. No oropharyngeal exudate.  Eyes: Conjunctivae are normal. Right eye exhibits no discharge. Left eye exhibits no discharge.  Neck: Normal range of motion. Neck supple.  Cardiovascular: Normal rate, regular rhythm and normal heart sounds.   Pulmonary/Chest: Effort normal and breath sounds  normal. No respiratory distress. She has no wheezes. She has no rales.  Lymphadenopathy:    She has no cervical adenopathy.  Neurological: She is alert and oriented to person, place, and time. She has normal strength. Coordination normal.  Skin: Skin is warm and dry. No rash noted. She is not diaphoretic.  Psychiatric: She has a normal mood and affect. Judgment normal.  Nursing note and vitals reviewed.   ED Course  Procedures (including critical care time) Labs Review Labs Reviewed - No data to display  Imaging Review No results found.   MDM   1. URI (upper respiratory infection)    URI versus sinusitis. Treat with azithromycin and codeine cough syrup. Follow-up when necessary if no improvement in a few days   Meds ordered this encounter  Medications  . guaiFENesin-codeine 100-10 MG/5ML syrup    Sig: Take 10 mLs by mouth every 4 (four) hours as needed for cough.    Dispense:  120 mL    Refill:  0  . azithromycin (ZITHROMAX Z-PAK) 250 MG tablet    Sig: Use as directed    Dispense:  6 each    Refill:  0       Liam Graham, PA-C 01/09/14 1314

## 2014-01-09 NOTE — ED Notes (Signed)
C/o sick x 1 week w congestion, cough.minimal relief w OTC medications

## 2014-01-25 ENCOUNTER — Encounter: Admit: 2014-01-25 | Discharge: 2014-01-25 | Payer: BLUE CROSS/BLUE SHIELD | Primary: Family Medicine

## 2014-02-15 ENCOUNTER — Encounter: Admit: 2014-02-15 | Discharge: 2014-02-15 | Payer: BLUE CROSS/BLUE SHIELD | Primary: Family Medicine

## 2014-02-17 ENCOUNTER — Encounter: Admit: 2014-02-17 | Discharge: 2014-02-17 | Payer: BLUE CROSS/BLUE SHIELD | Primary: Family Medicine

## 2014-02-22 ENCOUNTER — Institutional Professional Consult (permissible substitution): Admit: 2014-02-22 | Payer: BLUE CROSS/BLUE SHIELD | Primary: Family Medicine

## 2014-02-22 DIAGNOSIS — J301 Allergic rhinitis due to pollen: Secondary | ICD-10-CM

## 2014-02-22 MED ORDER — ALLERGY INJECTION
Status: AC
Start: 2014-02-22 — End: ?

## 2014-02-22 NOTE — Progress Notes (Signed)
Maintenance dose given today in the left arm sub Q

## 2014-06-21 ENCOUNTER — Encounter: Payer: Self-pay | Admitting: Family Medicine

## 2014-06-21 ENCOUNTER — Ambulatory Visit (INDEPENDENT_AMBULATORY_CARE_PROVIDER_SITE_OTHER): Payer: Self-pay | Admitting: Family Medicine

## 2014-06-21 VITALS — BP 138/83 | HR 63 | Temp 98.3°F | Ht 63.0 in | Wt 125.0 lb

## 2014-06-21 DIAGNOSIS — J22 Unspecified acute lower respiratory infection: Secondary | ICD-10-CM | POA: Insufficient documentation

## 2014-06-21 DIAGNOSIS — J988 Other specified respiratory disorders: Secondary | ICD-10-CM

## 2014-06-21 HISTORY — DX: Unspecified acute lower respiratory infection: J22

## 2014-06-21 MED ORDER — ALBUTEROL SULFATE HFA 108 (90 BASE) MCG/ACT IN AERS: 2.0000 | INHALATION_SPRAY | Freq: Four times a day (QID) | RESPIRATORY_TRACT | Status: DC | PRN

## 2014-06-21 MED ORDER — DOXYCYCLINE HYCLATE 100 MG PO TABS: 100.0000 mg | ORAL_TABLET | Freq: Two times a day (BID) | ORAL | Status: DC

## 2014-06-21 MED ORDER — PREDNISONE 50 MG PO TABS: ORAL_TABLET | ORAL | Status: DC

## 2014-06-21 NOTE — Progress Notes (Signed)
   Subjective:    Patient ID: Anna Valenzuela, female    DOB: 1955-07-20, 58 y.o.   MRN: [id number]  HPI 59 year old female with a past medical history of tobacco abuse presents for same day appointment with complaints of cough, chest congestion, and "sinus problems".  Patient reports that she developed cough last Thursday. Cough is productive of discolored sputum. She also reports associated chest congestion. Additionally, she states that she's been having sinus trouble as well. She states that she is blowing discolored mucus out of her nose.  She denies any associated fevers, chills, nausea, vomiting. She also denies associated shortness of breath.  No known sick contacts. She has taken several over-the-counter medications with little relief in her symptoms. No exacerbating factors.  Review of Systems Per HPI    Objective:   Physical Exam Filed Vitals:   06/21/14 1138  BP: 138/83  Pulse: 63  Temp: 98.3 F (36.8 C)   Vital signs reviewed.  Exam: General: well appearing, NAD. HEENT: NCAT. Normal TMs bilaterally. Oropharynx mildly erythematous. No exudates noted. Turbinates mildly erythematous. Cardiovascular: Soft 2/6 systolic murmur noted right upper sternal border. Normal rate and rhythm. Respiratory: Scattered wheezing. No increased work of breathing.    Assessment & Plan:  See Problem List

## 2014-06-21 NOTE — Assessment & Plan Note (Signed)
She currently has a respiratory tract infection which has likely led to a COPD exacerbation (although she does not have a diagnosis as this I suspect this clinically especially in the setting of tobacco abuse).  Treating as COPD exacerbation - Albuterol PRN, Doxycycline, Prednisone.

## 2014-06-21 NOTE — Patient Instructions (Signed)
It was nice to see you today.  This is likely an exacerbation of your underlying (although not diagnosed with spirometry) lung disease.  Take the medication as prescribed.    Follow up if you fail to improve or worsen.

## 2014-12-08 ENCOUNTER — Emergency Department (HOSPITAL_COMMUNITY): Payer: Self-pay

## 2014-12-08 ENCOUNTER — Encounter (HOSPITAL_COMMUNITY): Payer: Self-pay | Admitting: Emergency Medicine

## 2014-12-08 ENCOUNTER — Emergency Department (HOSPITAL_COMMUNITY)
Admission: EM | Admit: 2014-12-08 | Discharge: 2014-12-08 | Disposition: A | Discharge status: Home / Self Care | Payer: Self-pay | Attending: Emergency Medicine | Admitting: Emergency Medicine

## 2014-12-08 DIAGNOSIS — Z7982 Long term (current) use of aspirin: Secondary | ICD-10-CM | POA: Insufficient documentation

## 2014-12-08 DIAGNOSIS — S161XXA Strain of muscle, fascia and tendon at neck level, initial encounter: Secondary | ICD-10-CM | POA: Insufficient documentation

## 2014-12-08 DIAGNOSIS — F17210 Nicotine dependence, cigarettes, uncomplicated: Secondary | ICD-10-CM | POA: Insufficient documentation

## 2014-12-08 DIAGNOSIS — S6991XA Unspecified injury of right wrist, hand and finger(s), initial encounter: Secondary | ICD-10-CM | POA: Insufficient documentation

## 2014-12-08 DIAGNOSIS — Y9389 Activity, other specified: Secondary | ICD-10-CM | POA: Insufficient documentation

## 2014-12-08 DIAGNOSIS — Z8739 Personal history of other diseases of the musculoskeletal system and connective tissue: Secondary | ICD-10-CM | POA: Insufficient documentation

## 2014-12-08 DIAGNOSIS — Y998 Other external cause status: Secondary | ICD-10-CM | POA: Insufficient documentation

## 2014-12-08 DIAGNOSIS — S0990XA Unspecified injury of head, initial encounter: Secondary | ICD-10-CM | POA: Insufficient documentation

## 2014-12-08 DIAGNOSIS — S3992XA Unspecified injury of lower back, initial encounter: Secondary | ICD-10-CM | POA: Insufficient documentation

## 2014-12-08 DIAGNOSIS — Y92410 Unspecified street and highway as the place of occurrence of the external cause: Secondary | ICD-10-CM | POA: Insufficient documentation

## 2014-12-08 DIAGNOSIS — Z79899 Other long term (current) drug therapy: Secondary | ICD-10-CM | POA: Insufficient documentation

## 2014-12-08 DIAGNOSIS — Z792 Long term (current) use of antibiotics: Secondary | ICD-10-CM | POA: Insufficient documentation

## 2014-12-08 DIAGNOSIS — Z8639 Personal history of other endocrine, nutritional and metabolic disease: Secondary | ICD-10-CM | POA: Insufficient documentation

## 2014-12-08 MED ORDER — NAPROXEN 500 MG PO TABS: 500.0000 mg | ORAL_TABLET | Freq: Two times a day (BID) | ORAL | Status: DC

## 2014-12-08 MED ORDER — METHOCARBAMOL 750 MG PO TABS: 750.0000 mg | ORAL_TABLET | Freq: Three times a day (TID) | ORAL | Status: DC
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
## 2014-12-14 ENCOUNTER — Encounter: Payer: Self-pay | Admitting: Family Medicine

## 2014-12-14 ENCOUNTER — Ambulatory Visit (INDEPENDENT_AMBULATORY_CARE_PROVIDER_SITE_OTHER): Payer: Self-pay | Admitting: Family Medicine

## 2014-12-14 VITALS — BP 101/64 | HR 84 | Temp 98.3°F | Ht 63.0 in | Wt 121.6 lb

## 2014-12-14 DIAGNOSIS — S134XXA Sprain of ligaments of cervical spine, initial encounter: Secondary | ICD-10-CM | POA: Insufficient documentation

## 2014-12-14 DIAGNOSIS — S134XXD Sprain of ligaments of cervical spine, subsequent encounter: Secondary | ICD-10-CM

## 2014-12-14 DIAGNOSIS — M47899 Other spondylosis, site unspecified: Secondary | ICD-10-CM

## 2014-12-14 MED ORDER — METHOCARBAMOL 750 MG PO TABS: 750.0000 mg | ORAL_TABLET | Freq: Four times a day (QID) | ORAL | Status: DC | PRN

## 2014-12-14 MED ORDER — IBUPROFEN 800 MG PO TABS: 800.0000 mg | ORAL_TABLET | Freq: Three times a day (TID) | ORAL | Status: DC | PRN

## 2014-12-14 MED ORDER — LORAZEPAM 0.5 MG PO TABS: ORAL_TABLET | ORAL | Status: DC
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
## 2014-12-14 NOTE — Patient Instructions (Signed)
You will get a call about physical therapy.   I expect you to get back.   Please make an appointment to get caught up on some preventive care.

## 2014-12-14 NOTE — Assessment & Plan Note (Signed)
See flexion extension injury summary.
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
## 2014-12-26 ENCOUNTER — Ambulatory Visit: Payer: No Typology Code available for payment source | Attending: Family Medicine | Admitting: Physical Therapy

## 2014-12-26 DIAGNOSIS — M6289 Other specified disorders of muscle: Secondary | ICD-10-CM

## 2014-12-26 DIAGNOSIS — M436 Torticollis: Secondary | ICD-10-CM | POA: Insufficient documentation

## 2014-12-26 DIAGNOSIS — M542 Cervicalgia: Secondary | ICD-10-CM | POA: Insufficient documentation

## 2014-12-26 DIAGNOSIS — R209 Unspecified disturbances of skin sensation: Secondary | ICD-10-CM

## 2014-12-26 DIAGNOSIS — G729 Myopathy, unspecified: Secondary | ICD-10-CM | POA: Insufficient documentation

## 2014-12-26 DIAGNOSIS — R208 Other disturbances of skin sensation: Secondary | ICD-10-CM | POA: Insufficient documentation

## 2014-12-26 DIAGNOSIS — R29898 Other symptoms and signs involving the musculoskeletal system: Secondary | ICD-10-CM | POA: Insufficient documentation
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
## 2015-03-27 ENCOUNTER — Ambulatory Visit: Payer: Self-pay | Attending: Neurosurgery

## 2015-03-27 DIAGNOSIS — M436 Torticollis: Secondary | ICD-10-CM | POA: Insufficient documentation

## 2015-03-27 DIAGNOSIS — R293 Abnormal posture: Secondary | ICD-10-CM | POA: Insufficient documentation

## 2015-03-27 DIAGNOSIS — R29898 Other symptoms and signs involving the musculoskeletal system: Secondary | ICD-10-CM | POA: Insufficient documentation

## 2015-03-27 DIAGNOSIS — M542 Cervicalgia: Secondary | ICD-10-CM | POA: Insufficient documentation

## 2015-03-27 DIAGNOSIS — G729 Myopathy, unspecified: Secondary | ICD-10-CM | POA: Insufficient documentation

## 2015-03-27 DIAGNOSIS — G44201 Tension-type headache, unspecified, intractable: Secondary | ICD-10-CM | POA: Insufficient documentation

## 2015-03-27 DIAGNOSIS — M5010 Cervical disc disorder with radiculopathy, unspecified cervical region: Secondary | ICD-10-CM | POA: Insufficient documentation
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
## 2015-03-27 NOTE — Patient Instructions (Signed)
PERFORM ALL EXERCISES GENTLY AND WITH GOOD POSTURE.    20 SECOND HOLD, 3 REPS TO EACH SIDE. 4-5 TIMES EACH DAY.   AROM: Neck Rotation   Turn head slowly to look over one shoulder, then the other.   AROM: Neck Flexion   Bend head forward.   AROM: Lateral Neck Flexion   Slowly tilt head toward one shoulder, then the other.  Chin Protraction / Retraction    Slide head forward keeping chin level. Slide head back, pulling chin in. Hold each position _5__ seconds. Repeat _5__ times. Do _many__ sessions per day.  Copyright  VHI. All rights reserved.   Posture - Standing   Good posture is important. Avoid slouching and forward head thrust. Maintain curve in low back and align ears over shoulders, hips over ankles.  Pull your belly button in toward your back bone. Posture Tips DO: - stand tall and erect - keep chin tucked in - keep head and shoulders in alignment - check posture regularly in mirror or large window - pull head back against headrest in car seat;  Change your position often.  Sit with lumbar support. DON'T: - slouch or slump while watching TV or reading - sit, stand or lie in one position  for too long;  Sitting is especially hard on the spine so if you sit at a desk/use the computer, then stand up often! Copyright  VHI. All rights reserved.  Posture - Sitting  Sit upright, head facing forward. Try using a roll to support lower back. Keep shoulders relaxed, and avoid rounded back. Keep hips level with knees. Avoid crossing legs for long periods. Copyright  VHI. All rights reserved.  Chronic neck strain can develop because of poor posture and faulty work habits  Postural strain related to slumped sitting and forward head posture is a leading cause of headaches, neck and upper back pain  General strengthening and flexibility exercises are helpful in the treatment of neck pain.  Most importantly, you should learn to correct the posture that may be contributing to  chronic pain.   Change positions frequently  Change your work or home environment to improve posture and mechanics.   [facility] [address], [place], [place] [address] Phone # [phone] Fax [phone]

## 2015-04-03 ENCOUNTER — Ambulatory Visit: Payer: Self-pay

## 2015-04-03 DIAGNOSIS — M542 Cervicalgia: Secondary | ICD-10-CM

## 2015-04-03 DIAGNOSIS — M436 Torticollis: Secondary | ICD-10-CM

## 2015-04-03 DIAGNOSIS — M5010 Cervical disc disorder with radiculopathy, unspecified cervical region: Secondary | ICD-10-CM

## 2015-04-03 DIAGNOSIS — R293 Abnormal posture: Secondary | ICD-10-CM

## 2015-04-03 DIAGNOSIS — G44201 Tension-type headache, unspecified, intractable: Secondary | ICD-10-CM

## 2015-04-03 DIAGNOSIS — M6289 Other specified disorders of muscle: Secondary | ICD-10-CM
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
## 2015-04-11 ENCOUNTER — Encounter: Payer: Self-pay | Admitting: Physical Therapy

## 2015-04-18 ENCOUNTER — Ambulatory Visit: Payer: BLUE CROSS/BLUE SHIELD | Admitting: Physical Therapy

## 2015-04-18 DIAGNOSIS — M6289 Other specified disorders of muscle: Secondary | ICD-10-CM

## 2015-04-18 DIAGNOSIS — M436 Torticollis: Secondary | ICD-10-CM

## 2015-04-18 DIAGNOSIS — G44201 Tension-type headache, unspecified, intractable: Secondary | ICD-10-CM

## 2015-04-18 DIAGNOSIS — M542 Cervicalgia: Secondary | ICD-10-CM

## 2015-04-18 DIAGNOSIS — R293 Abnormal posture: Secondary | ICD-10-CM

## 2015-04-18 DIAGNOSIS — M5010 Cervical disc disorder with radiculopathy, unspecified cervical region: Secondary | ICD-10-CM

## 2015-04-18 DIAGNOSIS — R29898 Other symptoms and signs involving the musculoskeletal system: Secondary | ICD-10-CM
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
## 2015-04-18 NOTE — Patient Instructions (Signed)
Over Head Pull: Narrow Grip       On back, knees bent, feet flat, band across thighs, elbows straight but relaxed. Pull hands apart (start). Keeping elbows straight, bring arms up and over head, hands toward floor. Keep pull steady on band. Hold momentarily. Return slowly, keeping pull steady, back to start. Repeat _8__ times. Band color ___yellow ___   Side Pull: Double Arm   On back, knees bent, feet flat. Arms perpendicular to body, shoulder level, elbows straight but relaxed. Pull arms out to sides, elbows straight. Resistance band comes across collarbones, hands toward floor. Hold momentarily. Slowly return to starting position. Repeat _8__ times. Band color __yellow___   Sash   On back, knees bent, feet flat, left hand on left hip, right hand above left. Pull right arm DIAGONALLY (hip to shoulder) across chest. Bring right arm along head toward floor. Hold momentarily. Slowly return to starting position. Repeat _8__ times. Do with left arm. Band color __yellow____   Shoulder Rotation: Double Arm   On back, knees bent, feet flat, elbows tucked at sides, bent 90, hands palms up. Pull hands apart and down toward floor, keeping elbows near sides. Hold momentarily. Slowly return to starting position. Repeat __8_ times. Band color __yellow    Ruben Im PT [facility] [address] [place], [place] [address] Phone # [phone] Fax [phone]____

## 2015-04-25 ENCOUNTER — Encounter: Payer: Self-pay | Admitting: Physical Therapy

## 2015-11-04 ENCOUNTER — Other Ambulatory Visit: Payer: Self-pay | Admitting: Family Medicine

## 2016-01-11 ENCOUNTER — Other Ambulatory Visit: Payer: Self-pay | Admitting: Family Medicine

## 2016-06-28 ENCOUNTER — Other Ambulatory Visit: Payer: Self-pay | Admitting: Family Medicine

## 2016-08-26 ENCOUNTER — Ambulatory Visit (INDEPENDENT_AMBULATORY_CARE_PROVIDER_SITE_OTHER): Payer: BLUE CROSS/BLUE SHIELD | Admitting: Family Medicine

## 2016-08-26 ENCOUNTER — Encounter: Payer: Self-pay | Admitting: Family Medicine

## 2016-08-26 DIAGNOSIS — M47892 Other spondylosis, cervical region: Secondary | ICD-10-CM

## 2016-08-26 DIAGNOSIS — M5382 Other specified dorsopathies, cervical region: Secondary | ICD-10-CM

## 2016-08-26 DIAGNOSIS — F4323 Adjustment disorder with mixed anxiety and depressed mood: Secondary | ICD-10-CM

## 2016-08-26 MED ORDER — GABAPENTIN 100 MG PO CAPS: 100.0000 mg | ORAL_CAPSULE | Freq: Three times a day (TID) | ORAL | 3 refills | Status: DC

## 2016-08-26 MED ORDER — METHOCARBAMOL 750 MG PO TABS: 750.0000 mg | ORAL_TABLET | Freq: Four times a day (QID) | ORAL | 6 refills | Status: DC | PRN

## 2016-08-26 MED ORDER — IBUPROFEN 800 MG PO TABS: ORAL_TABLET | ORAL | 6 refills | Status: DC

## 2016-08-26 MED ORDER — LORAZEPAM 0.5 MG PO TABS: ORAL_TABLET | ORAL | 4 refills | Status: DC

## 2016-08-26 NOTE — Patient Instructions (Addendum)
You should try to be more active.  Do more, don't baby your neck.   You are way behind on the things that keep you healthy. You are overdue for  1. Colonoscopy - most important.  You had polyps and they can come back.   2. Mammogram 3. Pap smear 4. Blood work.  The biggest lifestyle thing you could do is to quit smoking.
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
## 2016-08-27 NOTE — Assessment & Plan Note (Addendum)
Refill lorazepam.  Limit refills on all meds so that I have leverage to insure she follows through on the health maint visits.  Framed the issue as she needs to become much better at self care.

## 2016-08-27 NOTE — Assessment & Plan Note (Signed)
Clearly this is now a chronic pain syndrome.  She should quit "babying" her neck and work to become progressively active.

## 2016-09-03 ENCOUNTER — Ambulatory Visit (INDEPENDENT_AMBULATORY_CARE_PROVIDER_SITE_OTHER): Payer: Self-pay

## 2016-09-03 ENCOUNTER — Ambulatory Visit (HOSPITAL_COMMUNITY)
Admission: EM | Admit: 2016-09-03 | Discharge: 2016-09-03 | Disposition: A | Discharge status: Home / Self Care | Payer: Self-pay | Attending: Family Medicine | Admitting: Family Medicine

## 2016-09-03 ENCOUNTER — Encounter (HOSPITAL_COMMUNITY): Payer: Self-pay | Admitting: Emergency Medicine

## 2016-09-03 DIAGNOSIS — M542 Cervicalgia: Secondary | ICD-10-CM

## 2016-09-03 DIAGNOSIS — W19XXXA Unspecified fall, initial encounter: Secondary | ICD-10-CM

## 2016-09-03 MED ORDER — PREDNISONE 10 MG (21) PO TBPK
ORAL_TABLET | ORAL | 0 refills | Status: DC
Start: 2016-09-03 — End: 2017-01-24

## 2016-09-03 NOTE — ED Provider Notes (Signed)
  [facility]   [id number] 09/03/16 Arrival Time: [place] PLAN:  1. Fall, initial encounter   2. Cervicalgia     Meds ordered this encounter  Medications  . predniSONE (STERAPRED UNI-PAK 21 TAB) 10 MG (21) TBPK tablet    Sig: Take 6-5-4-3-2-1 po qd    Dispense:  21 tablet    Refill:  0    Order Specific Question:   Supervising Provider    Answer:   Sherlene Shams [[id number]]    Reviewed expectations re: course of current medical issues. Questions answered. Outlined signs and symptoms indicating need for more acute intervention. Patient verbalized understanding. After Visit Summary given.   SUBJECTIVE:  Anna Valenzuela is a 61 y.o. female who presents with complaint of right neck pain with radiation down her right arm.  She has hx of DDD and s/p cervical spine surgery.   She fell on 9 August and her neck pain is getting worse.    ROS: As per HPI.   OBJECTIVE:  Vitals:   09/03/16 1525  BP: 109/80  Pulse: 72  Temp: 98.5 F (36.9 C)  TempSrc: Oral  SpO2: 100%     General appearance: alert; no distress HEENT: normocephalic; atraumatic; conjunctivae normal; TMs normal; nasal mucosa normal; oral mucosa normal Neck: decreased rom Lungs: clear to auscultation bilaterally Heart: regular rate and rhythm Neurologic: normal symmetric reflexes; normal gait Psychological:  alert and cooperative; normal mood and affect    Labs Reviewed - No data to display  Dg Cervical Spine Complete  Result Date: 09/03/2016 CLINICAL DATA:  Fall on wet floor. Degenerative disc disease status post fusion. Right cervical radicular symptoms. EXAM: CERVICAL SPINE - COMPLETE 4+ VIEW COMPARISON:  Cervical spine MRI 12/24/2014 FINDINGS: C5-6 ACDF with solid arthrodesis. Adjacent segment disc degeneration at C4-5 and C6-7. There is a degree of right C4-5 foraminal stenosis, likely accentuated by positioning. No notable left foraminal bony narrowing. No fracture deformity,  endplate erosion, or evidence of focal bone lesion. Hardware is in place. No prevertebral thickening. IMPRESSION: 1. No acute finding. 2. C5-6 ACDF with solid arthrodesis. 3. Adjacent segment disc degeneration with mild or moderate right C4-5 foraminal narrowing. Electronically Signed   By: Monte Fantasia M.D.   On: 09/03/2016 15:53    Allergies  Allergen Reactions  . Sulfa Antibiotics   . Ceftin Palpitations    PMHx, SurgHx, SocialHx, Medications, and Allergies were reviewed in the Visit Navigator and updated as appropriate.      Lysbeth Penner, FNP 09/03/16 1606

## 2016-09-03 NOTE — Discharge Instructions (Signed)
Continue robaxin from home for pain
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
## 2016-09-04 ENCOUNTER — Ambulatory Visit: Payer: Self-pay | Admitting: Student in an Organized Health Care Education/Training Program

## 2016-09-11 ENCOUNTER — Encounter: Payer: Self-pay | Admitting: Internal Medicine

## 2016-09-11 ENCOUNTER — Ambulatory Visit (INDEPENDENT_AMBULATORY_CARE_PROVIDER_SITE_OTHER): Payer: Self-pay | Admitting: Internal Medicine

## 2016-09-11 ENCOUNTER — Other Ambulatory Visit (HOSPITAL_COMMUNITY)
Admission: RE | Admit: 2016-09-11 | Discharge: 2016-09-11 | Disposition: A | Discharge status: Home / Self Care | Payer: Self-pay | Source: Ambulatory Visit | Attending: Family Medicine | Admitting: Family Medicine

## 2016-09-11 VITALS — BP 100/64 | HR 72 | Temp 98.5°F | Ht 63.0 in | Wt 121.8 lb

## 2016-09-11 DIAGNOSIS — F32A Depression, unspecified: Secondary | ICD-10-CM

## 2016-09-11 DIAGNOSIS — R5383 Other fatigue: Secondary | ICD-10-CM

## 2016-09-11 DIAGNOSIS — Z124 Encounter for screening for malignant neoplasm of cervix: Secondary | ICD-10-CM

## 2016-09-11 DIAGNOSIS — Z113 Encounter for screening for infections with a predominantly sexual mode of transmission: Secondary | ICD-10-CM

## 2016-09-11 DIAGNOSIS — E785 Hyperlipidemia, unspecified: Secondary | ICD-10-CM

## 2016-09-11 DIAGNOSIS — F329 Major depressive disorder, single episode, unspecified: Secondary | ICD-10-CM

## 2016-09-11 DIAGNOSIS — Z0000 Encounter for general adult medical examination without abnormal findings: Secondary | ICD-10-CM

## 2016-09-11 DIAGNOSIS — E7800 Pure hypercholesterolemia, unspecified: Secondary | ICD-10-CM

## 2016-09-11 LAB — POCT WET PREP (WET MOUNT)
CLUE CELLS WET PREP WHIFF POC: POSITIVE
TRICHOMONAS WET PREP HPF POC: ABSENT

## 2016-09-11 MED ORDER — BUPROPION HCL ER (XL) 150 MG PO TB24: 150.0000 mg | ORAL_TABLET | Freq: Every day | ORAL | 1 refills | Status: DC
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
## 2016-09-11 NOTE — Patient Instructions (Signed)
Ms. Gillson,  I have prescribed wellbutrin 150 mg to take daily for mood.  I recommend replens 3 times weekly for vaginal dryness.  I will call you with your lab results.  Please return in about 3 weeks to re-address mood.  Best, Dr. Ola Spurr

## 2016-09-12 ENCOUNTER — Other Ambulatory Visit: Payer: Self-pay | Admitting: Internal Medicine

## 2016-09-12 LAB — VITAMIN D 25 HYDROXY (VIT D DEFICIENCY, FRACTURES): VIT D 25 HYDROXY: 29.1 ng/mL — AB (ref 30.0–100.0)

## 2016-09-12 LAB — RPR: RPR: NONREACTIVE

## 2016-09-12 LAB — TSH: TSH: 1.23 u[IU]/mL (ref 0.450–4.500)

## 2016-09-12 LAB — LIPID PANEL
CHOL/HDL RATIO: 2.5 ratio (ref 0.0–4.4)
Cholesterol, Total: 186 mg/dL (ref 100–199)
HDL: 74 mg/dL (ref >39)
LDL Calculated: 90 mg/dL (ref 0–99)
Triglycerides: 108 mg/dL (ref 0–149)
VLDL Cholesterol Cal: 22 mg/dL (ref 5–40)

## 2016-09-12 LAB — HIV ANTIBODY (ROUTINE TESTING W REFLEX): HIV SCREEN 4TH GENERATION: NONREACTIVE

## 2016-09-12 MED ORDER — METRONIDAZOLE 500 MG PO TABS: 500.0000 mg | ORAL_TABLET | Freq: Two times a day (BID) | ORAL | 0 refills | Status: DC

## 2016-09-12 MED ORDER — FLUCONAZOLE 150 MG PO TABS: 150.0000 mg | ORAL_TABLET | Freq: Once | ORAL | 0 refills | Status: AC

## 2016-09-13 LAB — CYTOLOGY - PAP
Adequacy: ABSENT
DIAGNOSIS: NEGATIVE
HPV: NOT DETECTED

## 2016-09-13 LAB — CERVICOVAGINAL ANCILLARY ONLY
CHLAMYDIA, DNA PROBE: NEGATIVE
Neisseria Gonorrhea: NEGATIVE

## 2016-09-13 NOTE — Assessment & Plan Note (Signed)
-   Ordered lipid panel.  - Encouraged trying to find a form of aerobic exercise she enjoys.

## 2016-09-13 NOTE — Assessment & Plan Note (Addendum)
-   Moderately severe score on PHQ-9. - Patient mentioned interest in wellbutrin. Ordered wellbutrin 150 mg for patient to start on. Counseled to stop immediately if notices worsening of mood. - Encouraged her to continue her faith-based group counseling.  - Ordered TSH and vitamin D levels for patient's concern of increased fatigue.

## 2016-09-16 ENCOUNTER — Telehealth: Payer: Self-pay | Admitting: Family Medicine

## 2016-09-16 ENCOUNTER — Encounter: Payer: Self-pay | Admitting: Internal Medicine

## 2016-09-16 NOTE — Telephone Encounter (Signed)
Patient called stating [facility] did not get the Rx for metronidazole. Patient is also requesting a Rx for yeast infection. She reported getting yeast infection antibiotic use.  Left voice message on [place] line for the metronidazole 500 mg.  Derl Barrow, RN

## 2016-09-16 NOTE — Telephone Encounter (Signed)
Pt called because we were suppose to fax over prescriptions for her Wellbutrin and Flagyl. Please check the status of this since [facility] didn't receive the fax. jw

## 2016-09-17 ENCOUNTER — Other Ambulatory Visit: Payer: Self-pay | Admitting: Internal Medicine

## 2016-09-17 MED ORDER — FLUCONAZOLE 150 MG PO TABS: ORAL_TABLET | ORAL | 0 refills | Status: DC

## 2016-09-17 NOTE — Telephone Encounter (Signed)
Thank you. I called in the diflucan order and confirmed they had received the order for metronidazole.

## 2016-09-17 NOTE — Telephone Encounter (Signed)
Placed order for diflucan over the phone with [facility]. Confirmed that they had received rx for flagyl.

## 2016-11-28 ENCOUNTER — Other Ambulatory Visit: Payer: Self-pay | Admitting: Family Medicine

## 2016-11-28 MED ORDER — BUPROPION HCL ER (XL) 150 MG PO TB24: 150.0000 mg | ORAL_TABLET | Freq: Every day | ORAL | 6 refills | Status: DC

## 2017-01-23 ENCOUNTER — Encounter: Payer: Self-pay | Admitting: Family Medicine

## 2017-01-23 ENCOUNTER — Other Ambulatory Visit: Payer: Self-pay

## 2017-01-23 ENCOUNTER — Ambulatory Visit (INDEPENDENT_AMBULATORY_CARE_PROVIDER_SITE_OTHER): Payer: Self-pay | Admitting: Family Medicine

## 2017-01-23 DIAGNOSIS — S2241XD Multiple fractures of ribs, right side, subsequent encounter for fracture with routine healing: Secondary | ICD-10-CM

## 2017-01-23 DIAGNOSIS — J22 Unspecified acute lower respiratory infection: Secondary | ICD-10-CM
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
## 2017-01-24 ENCOUNTER — Encounter: Payer: Self-pay | Admitting: Family Medicine

## 2017-01-24 DIAGNOSIS — S2239XA Fracture of one rib, unspecified side, initial encounter for closed fracture: Secondary | ICD-10-CM | POA: Insufficient documentation

## 2017-01-24 HISTORY — DX: Fracture of one rib, unspecified side, initial encounter for closed fracture: S22.39XA
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
## 2017-01-24 NOTE — Assessment & Plan Note (Signed)
Given time course and exam, almost certainly a rib fracture.  No x ray needed unless pain persists beyond 6 weeks.

## 2017-01-24 NOTE — Assessment & Plan Note (Signed)
URI without complications.  OTC symptomatic rx and expectant observation.

## 2017-01-29 ENCOUNTER — Encounter (HOSPITAL_COMMUNITY): Payer: Self-pay | Admitting: Emergency Medicine

## 2017-01-29 ENCOUNTER — Ambulatory Visit (INDEPENDENT_AMBULATORY_CARE_PROVIDER_SITE_OTHER): Payer: Self-pay

## 2017-01-29 ENCOUNTER — Other Ambulatory Visit: Payer: Self-pay

## 2017-01-29 ENCOUNTER — Ambulatory Visit (HOSPITAL_COMMUNITY)
Admission: EM | Admit: 2017-01-29 | Discharge: 2017-01-29 | Disposition: A | Discharge status: Home / Self Care | Payer: Self-pay | Attending: Family Medicine | Admitting: Family Medicine

## 2017-01-29 ENCOUNTER — Telehealth: Payer: Self-pay | Admitting: *Deleted

## 2017-01-29 DIAGNOSIS — R059 Cough, unspecified: Secondary | ICD-10-CM

## 2017-01-29 DIAGNOSIS — R05 Cough: Secondary | ICD-10-CM

## 2017-01-29 DIAGNOSIS — J181 Lobar pneumonia, unspecified organism: Secondary | ICD-10-CM

## 2017-01-29 DIAGNOSIS — J189 Pneumonia, unspecified organism: Secondary | ICD-10-CM

## 2017-01-29 DIAGNOSIS — R509 Fever, unspecified: Secondary | ICD-10-CM

## 2017-01-29 MED ORDER — AZITHROMYCIN 250 MG PO TABS: 250.0000 mg | ORAL_TABLET | Freq: Every day | ORAL | 0 refills | Status: DC

## 2017-01-29 MED ORDER — HYDROCODONE-HOMATROPINE 5-1.5 MG/5ML PO SYRP: 5.0000 mL | ORAL_SOLUTION | Freq: Four times a day (QID) | ORAL | 0 refills | Status: DC | PRN

## 2017-01-29 MED FILL — HYDROCODONE-HOMATROPINE SYR: 5-1.5 | 5 days supply | Qty: 90 | Fill #0

## 2017-01-29 MED FILL — AZITHROMYCIN 250 MG TABLET: 250 | 5 days supply | Qty: 6 | Fill #0

## 2017-01-29 NOTE — Telephone Encounter (Signed)
Patient left message on nurse line requesting something for cough productive of green sputum, 102.9 fever last evening, and left-sided pain. Has since been seen in urgent Care. Hubbard Hartshorn, RN, BSN

## 2017-01-29 NOTE — Discharge Instructions (Signed)
Be aware, your cough medication may cause drowsiness. Please do not drive, operate heavy machinery or make important decisions while on this medication, it can cloud your judgement.  Please follow up in the next 48 hours for recheck.

## 2017-01-29 NOTE — ED Triage Notes (Signed)
Pt c/o fever last night, states coughing x2 weeks.

## 2017-01-30 NOTE — Telephone Encounter (Signed)
Reviewed records.  She was seen in the ER on 1/9 and found to have pneumonia on CXR.  Given azithro.  No additional Rx is needed.

## 2017-01-30 NOTE — ED Provider Notes (Signed)
[place]   [id number] 01/29/17 Arrival Time: 1478  ASSESSMENT & PLAN:  1. Fever and chills   2. Cough   3. Pneumonia of right middle lobe due to infectious organism [facility])     Meds ordered this encounter  Medications  . azithromycin (ZITHROMAX) 250 MG tablet    Sig: Take 1 tablet (250 mg total) by mouth daily. Take first 2 tablets together, then 1 every day until finished.    Dispense:  6 tablet    Refill:  0  . HYDROcodone-homatropine (HYCODAN) 5-1.5 MG/5ML syrup    Sig: Take 5 mLs by mouth every 6 (six) hours as needed for cough.    Dispense:  90 mL    Refill:  0   Instructions given and discussed regarding prompt follow up in the next few days, especially if she feels that she is worsening. No indications at this time for hospital admission.  Medication sedation precautions. OTC symptom care as needed. Ensure adequate fluid intake and rest.  Reviewed expectations re: course of current medical issues. Questions answered. Outlined signs and symptoms indicating need for more acute intervention. Patient verbalized understanding. After Visit Summary given.   SUBJECTIVE: History from: patient.  Anna Valenzuela is a 62 y.o. female who presents with complaint of nasal congestion, post-nasal drainage, and a persistent dry cough. Onset abrupt, approximately 2 weeks ago. Overall fatigued with body aches. SOB: mild but transient. Worse when coughing. No specific aggravating or alleviating factors reported. Wheezing: mild. Fever: yes, subjective. Overall normal PO intake without n/v. Sick contacts: no. Ambulatory without assistance. "Just feel wiped out." OTC treatment: cough medication without relief.  Received flu shot this year: no. Social History   Tobacco Use  Smoking Status Current Every Day Smoker  . Packs/day: 1.00  . Types: Cigarettes  Smokeless Tobacco Never Used   ROS: As per HPI. All other systems negative.   OBJECTIVE:  Vitals:   01/29/17 1513    BP: 101/68  Pulse: 100  Resp: 18  Temp: 99.7 F (37.6 C)  SpO2: 99%    General appearance: alert; appears fatigued HEENT: nasal congestion Neck: supple without LAD Lungs: unlabored respirations; overall appears to have symmetrical air entry; cough: moderate; no respiratory distress CV: regular rhythm Abd: soft and nontender Skin: warm and dry Psychological: alert and cooperative; normal mood and affect  Imaging: Dg Chest 2 View  Result Date: 01/29/2017 CLINICAL DATA:  Fever and cough EXAM: CHEST  2 VIEW COMPARISON:  None. FINDINGS: There are areas of airspace consolidation in the right middle lobe and lingula, likely multifocal pneumonia. Lungs elsewhere clear. Heart size and pulmonary vascularity are normal. No adenopathy. No evident bone lesions. IMPRESSION: Areas of airspace consolidation in the right middle lobe and lingula, likely multifocal pneumonia. Lungs elsewhere clear. Heart size normal. It should be noted that an underlying mass in one or both of these areas could easily be obscured by the consolidation currently present. Followup PA and lateral chest radiographs recommended in 3-4 weeks following trial of antibiotic therapy to ensure resolution and exclude underlying malignancy. These results will be called to the ordering clinician or representative by the Radiologist Assistant, and communication documented in the PACS or zVision Dashboard. Electronically Signed   By: Lowella Grip III M.D.   On: 01/29/2017 16:23    Allergies  Allergen Reactions  . Sulfa Antibiotics   . Ceftin Palpitations    Past Medical History:  Diagnosis Date  . Osteoporosis   . Thyroid disease  Family History  Problem Relation Age of Onset  . Alcohol abuse Father   . Hypertension Father   . Kidney disease Sister   . Mental illness Son        schizophrenia   Social History   Socioeconomic History  . Marital status: Divorced    Spouse name: Not on file  . Number of children: Not  on file  . Years of education: Not on file  . Highest education level: Not on file  Social Needs  . Financial resource strain: Not on file  . Food insecurity - worry: Not on file  . Food insecurity - inability: Not on file  . Transportation needs - medical: Not on file  . Transportation needs - non-medical: Not on file  Occupational History  . Not on file  Tobacco Use  . Smoking status: Current Every Day Smoker    Packs/day: 1.00    Types: Cigarettes  . Smokeless tobacco: Never Used  Substance and Sexual Activity  . Alcohol use: Yes    Alcohol/week: 0.5 oz    Types: 1 Standard drinks or equivalent per week  . Drug use: No  . Sexual activity: Not Currently  Other Topics Concern  . Not on file  Social History Narrative  . Not on file           Vanessa Kick, MD 01/30/17 [phone]

## 2017-02-10 ENCOUNTER — Other Ambulatory Visit: Payer: Self-pay | Admitting: Family Medicine

## 2017-03-05 ENCOUNTER — Ambulatory Visit: Payer: Self-pay

## 2017-03-07 ENCOUNTER — Other Ambulatory Visit: Payer: Self-pay | Admitting: Family Medicine

## 2017-03-07 DIAGNOSIS — F4323 Adjustment disorder with mixed anxiety and depressed mood: Secondary | ICD-10-CM

## 2017-04-09 ENCOUNTER — Other Ambulatory Visit: Payer: Self-pay | Admitting: Obstetrics and Gynecology

## 2017-04-24 ENCOUNTER — Ambulatory Visit (HOSPITAL_COMMUNITY): Payer: Self-pay

## 2017-05-14 ENCOUNTER — Other Ambulatory Visit: Payer: Self-pay | Admitting: Obstetrics and Gynecology

## 2017-05-14 DIAGNOSIS — R928 Other abnormal and inconclusive findings on diagnostic imaging of breast: Secondary | ICD-10-CM

## 2017-05-29 ENCOUNTER — Ambulatory Visit: Payer: Self-pay

## 2017-05-29 ENCOUNTER — Ambulatory Visit (HOSPITAL_COMMUNITY)
Admission: RE | Admit: 2017-05-29 | Discharge: 2017-05-29 | Disposition: A | Discharge status: Home / Self Care | Payer: Self-pay | Source: Ambulatory Visit | Attending: Obstetrics and Gynecology | Admitting: Obstetrics and Gynecology

## 2017-05-29 ENCOUNTER — Encounter (HOSPITAL_COMMUNITY): Payer: Self-pay

## 2017-05-29 ENCOUNTER — Ambulatory Visit
Admission: RE | Admit: 2017-05-29 | Discharge: 2017-05-29 | Disposition: A | Discharge status: Home / Self Care | Payer: No Typology Code available for payment source | Source: Ambulatory Visit | Attending: Obstetrics and Gynecology | Admitting: Obstetrics and Gynecology

## 2017-05-29 VITALS — BP 110/68 | Ht 61.5 in | Wt 124.4 lb

## 2017-05-29 DIAGNOSIS — Z1239 Encounter for other screening for malignant neoplasm of breast: Secondary | ICD-10-CM

## 2017-05-29 DIAGNOSIS — R928 Other abnormal and inconclusive findings on diagnostic imaging of breast: Secondary | ICD-10-CM

## 2017-05-29 DIAGNOSIS — N644 Mastodynia: Secondary | ICD-10-CM

## 2017-05-29 NOTE — Progress Notes (Signed)
Patient referred to [facility] by the [facility] due to recommending additional imaging of bilateral breasts. Last screening mammogram completed 08/27/2011.  Pap Smear: Pap smear not completed today. Last Pap smear was 09/11/2016 at the [facility] and normal with negative HPV. Per patient has no history of an abnormal Pap smear. Last Pap smear result is in Epic.  Physical exam: Breasts Breasts symmetrical. No skin abnormalities bilateral breasts. No nipple retraction bilateral breasts. No nipple discharge bilateral breasts. No lymphadenopathy. No lumps palpated bilateral breasts. Complaints of bilateral outer breast tenderness on exam. Referred patient to the [facility] for a bilateral diagnostic mammogram per recommendation. Appointment scheduled for Thursday, May 29, 2017 at 1320.        Pelvic/Bimanual No Pap smear completed today since last Pap smear and HPV typing was 09/11/2016. Pap smear not indicated per BCCCP guidelines.   Smoking History: Patient is a current smoker. Discussed smoking cessation with patient. Referred to the [facility] Quitline and gave resources to the free smoking cessation classes at [facility].  Patient Navigation: Patient education provided. Access to services provided for patient through [facility] program.   Colorectal Cancer Screening: Per patient had a colonoscopy completed around 2010. No complaints today. FIT Test given to patient to complete and return to BCCCP.  Breast and Cervical Cancer Risk Assessment: Patient has a family history of a first cousin having breast cancer. Patient has no known genetic mutations or history of radiation treatment to the chest before age 54. Patient has no history of cervical dysplasia, immunocompromised, or DES exposure in-utero.

## 2017-05-29 NOTE — Patient Instructions (Addendum)
Explained breast self awareness with Anna Valenzuela. Patient did not need a Pap smear today due to last Pap smear and HPV typing was 09/11/2016. Let her know BCCCP will cover Pap smears and HPV typing every 5 years unless has a history of abnormal Pap smears. Referred patient to the [facility] for a bilateral diagnostic mammogram per recommendation. Appointment scheduled for Thursday, May 29, 2017 at 1320. Discussed smoking cessation with patient. Referred to the [facility] Quitline and gave resources to the free smoking cessation classes at [facility]. Anna Valenzuela verbalized understanding.  Anna Valenzuela, Arvil Chaco, RN 12:54 PM

## 2017-06-25 ENCOUNTER — Encounter (HOSPITAL_COMMUNITY): Payer: Self-pay | Admitting: *Deleted

## 2017-06-26 ENCOUNTER — Other Ambulatory Visit: Payer: Self-pay

## 2017-07-04 LAB — FECAL OCCULT BLOOD, IMMUNOCHEMICAL: Fecal Occult Bld: POSITIVE — AB

## 2017-07-21 ENCOUNTER — Telehealth (HOSPITAL_COMMUNITY): Payer: Self-pay | Admitting: *Deleted

## 2017-07-21 NOTE — Telephone Encounter (Signed)
Called patient to discuss FIT Test result. Let patient know that her FIT Test was positive. Patient stated she had a colonoscopy completed in 2010 and had 9 polyps removed. Patient stated she has also has had hemorrhoid surgery. Patient stated she was supposed to have a colonoscopy completed in 5 years but has not followed-up due to not having insurance. Patient has a PCP at [facility]. Patient is not eligible for the orange card due to now living in [facility]. Told patient that I will call her PCP to make a referral for follow-up. Patient gave me all of the contact information. Let patient know will call her back to follow-up after speak to her PCP. Patient verbalized understanding.  Attempted to call her PCP's office to make referral for follow-up. Left voicemail to receive a return call.

## 2017-07-23 ENCOUNTER — Telehealth (HOSPITAL_COMMUNITY): Payer: Self-pay | Admitting: *Deleted

## 2017-07-23 NOTE — Telephone Encounter (Signed)
Spoke to [place] at [facility] where patients PCP is located. Abnormal results reported and referral made to PCP. Shelly stated she will call patient and give her follow-up appointment with PCP.

## 2017-10-26 ENCOUNTER — Ambulatory Visit (INDEPENDENT_AMBULATORY_CARE_PROVIDER_SITE_OTHER): Payer: Self-pay

## 2017-10-26 ENCOUNTER — Encounter (HOSPITAL_COMMUNITY): Payer: Self-pay | Admitting: Emergency Medicine

## 2017-10-26 ENCOUNTER — Ambulatory Visit (HOSPITAL_COMMUNITY)
Admission: EM | Admit: 2017-10-26 | Discharge: 2017-10-26 | Disposition: A | Discharge status: Home / Self Care | Payer: Medicaid Other | Attending: Internal Medicine | Admitting: Internal Medicine

## 2017-10-26 DIAGNOSIS — J069 Acute upper respiratory infection, unspecified: Secondary | ICD-10-CM

## 2017-10-26 DIAGNOSIS — R05 Cough: Secondary | ICD-10-CM

## 2017-10-26 DIAGNOSIS — F17210 Nicotine dependence, cigarettes, uncomplicated: Secondary | ICD-10-CM

## 2017-10-26 DIAGNOSIS — J9811 Atelectasis: Secondary | ICD-10-CM

## 2017-10-26 MED ORDER — IPRATROPIUM-ALBUTEROL 0.5-2.5 (3) MG/3ML IN SOLN
3.0000 mL | Freq: Once | RESPIRATORY_TRACT | Status: AC
  Administered 2017-10-26: 3 mL via RESPIRATORY_TRACT

## 2017-10-26 MED ORDER — AZITHROMYCIN 250 MG PO TABS: 250.0000 mg | ORAL_TABLET | Freq: Every day | ORAL | 0 refills | Status: DC

## 2017-10-26 MED ORDER — IPRATROPIUM-ALBUTEROL 0.5-2.5 (3) MG/3ML IN SOLN
RESPIRATORY_TRACT | Status: AC
  Filled 2017-10-26: qty 3

## 2017-10-26 MED ORDER — ALBUTEROL SULFATE HFA 108 (90 BASE) MCG/ACT IN AERS: 2.0000 | INHALATION_SPRAY | Freq: Four times a day (QID) | RESPIRATORY_TRACT | 0 refills | Status: DC | PRN
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
## 2017-10-26 NOTE — Discharge Instructions (Signed)
Push fluids to ensure adequate hydration and keep secretions thin.  Tylenol and/or ibuprofen as needed for pain or fevers.  Complete course of antibiotics.  Use of inhaler as needed for wheezing or shortness of breath .  If symptoms worsen or do not improve in the next week to return to be seen or to follow up with your PCP.

## 2017-10-26 NOTE — ED Triage Notes (Signed)
Pt here for increased cough; pt sts multiple episodes of PNA this year

## 2017-11-12 ENCOUNTER — Ambulatory Visit (HOSPITAL_COMMUNITY)
Admission: EM | Admit: 2017-11-12 | Discharge: 2017-11-12 | Disposition: A | Discharge status: Home / Self Care | Payer: Self-pay | Attending: Family Medicine | Admitting: Family Medicine

## 2017-11-12 ENCOUNTER — Other Ambulatory Visit: Payer: Self-pay

## 2017-11-12 ENCOUNTER — Encounter (HOSPITAL_COMMUNITY): Payer: Self-pay | Admitting: Emergency Medicine

## 2017-11-12 DIAGNOSIS — J019 Acute sinusitis, unspecified: Secondary | ICD-10-CM

## 2017-11-12 MED ORDER — HYDROCODONE-HOMATROPINE 5-1.5 MG/5ML PO SYRP: 5.0000 mL | ORAL_SOLUTION | Freq: Four times a day (QID) | ORAL | 0 refills | Status: DC | PRN

## 2017-11-12 MED ORDER — AMOXICILLIN-POT CLAVULANATE 875-125 MG PO TABS: 1.0000 | ORAL_TABLET | Freq: Two times a day (BID) | ORAL | 0 refills | Status: DC

## 2017-11-12 MED ORDER — FLUTICASONE PROPIONATE 50 MCG/ACT NA SUSP: 1.0000 | Freq: Every day | NASAL | 2 refills | Status: DC

## 2017-11-12 NOTE — ED Triage Notes (Signed)
Pt reports continued problems with her sinuses since she was seen here on 10/26/17.  She was given a breathing treatment, Z-Pack and Hycodan for her symptoms.  Pt is not getting better.  She reports green mucus from her eyes, nose and her chest.  She is requesting Augmentin and more Hycodan as the Augmentin has helped her in the past.

## 2017-11-12 NOTE — Discharge Instructions (Signed)
We will go ahead and treat you for sinus infection.  Cough medication for the evening to help with sleep.
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
## 2019-01-22 DIAGNOSIS — C4491 Basal cell carcinoma of skin, unspecified: Secondary | ICD-10-CM

## 2019-01-22 HISTORY — DX: Basal cell carcinoma of skin, unspecified: C44.91

## 2019-01-22 HISTORY — PX: BASAL CELL CARCINOMA EXCISION: SHX1214

## 2019-07-16 ENCOUNTER — Encounter: Payer: Self-pay | Admitting: Gastroenterology

## 2019-08-19 ENCOUNTER — Ambulatory Visit (AMBULATORY_SURGERY_CENTER): Payer: Self-pay

## 2019-08-19 ENCOUNTER — Other Ambulatory Visit: Payer: Self-pay

## 2019-08-19 VITALS — Ht 61.5 in | Wt 137.0 lb

## 2019-08-19 DIAGNOSIS — Z86010 Personal history of colonic polyps: Secondary | ICD-10-CM

## 2019-08-19 DIAGNOSIS — Z01818 Encounter for other preprocedural examination: Secondary | ICD-10-CM

## 2019-08-19 MED ORDER — NA SULFATE-K SULFATE-MG SULF 17.5-3.13-1.6 GM/177ML PO SOLN: 1.0000 | Freq: Once | ORAL | 0 refills | Status: AC

## 2019-08-19 NOTE — Progress Notes (Signed)
No egg or soy allergy known to patient  No issues with past sedation with any surgeries or procedures No intubation problems in the past  No diet pills per patient No home 02 use per patient  No blood thinners per patient  Pt denies issues with constipation  No A fib or A flutter  EMMI video to pt and to [place] 19 guidelines implemented in PV today   Coupon given to pt in PV today  COVID screening scheduled on 09/02/2019;  Due to the COVID-19 pandemic we are asking patients to follow these guidelines. Please only bring one care partner. Please be aware that your care partner may wait in the car in the parking lot or if they feel like they will be too hot to wait in the car, they may wait in the lobby on the 4th floor. All care partners are required to wear a mask the entire time (we do not have any that we can provide them), they need to practice social distancing, and we will do a Covid check for all patient's and care partners when you arrive. Also we will check their temperature and your temperature. If the care partner waits in their car they need to stay in the parking lot the entire time and we will call them on their cell phone when the patient is ready for discharge so they can bring the car to the front of the building. Also all patient's will need to wear a mask into building.

## 2019-09-02 ENCOUNTER — Ambulatory Visit (INDEPENDENT_AMBULATORY_CARE_PROVIDER_SITE_OTHER): Payer: 59

## 2019-09-02 ENCOUNTER — Other Ambulatory Visit: Payer: Self-pay | Admitting: Gastroenterology

## 2019-09-02 DIAGNOSIS — Z1159 Encounter for screening for other viral diseases: Secondary | ICD-10-CM

## 2019-09-02 LAB — SARS CORONAVIRUS 2 (TAT 6-24 HRS): SARS Coronavirus 2: NEGATIVE

## 2019-09-06 ENCOUNTER — Other Ambulatory Visit: Payer: Self-pay

## 2019-09-06 ENCOUNTER — Ambulatory Visit (AMBULATORY_SURGERY_CENTER): Payer: 59 | Admitting: Gastroenterology

## 2019-09-06 ENCOUNTER — Encounter: Payer: Self-pay | Admitting: Gastroenterology

## 2019-09-06 VITALS — BP 93/50 | HR 77 | Temp 96.8°F | Resp 18 | Ht 61.5 in | Wt 137.0 lb

## 2019-09-06 DIAGNOSIS — Z86010 Personal history of colonic polyps: Secondary | ICD-10-CM | POA: Diagnosis not present

## 2019-09-06 DIAGNOSIS — D123 Benign neoplasm of transverse colon: Secondary | ICD-10-CM

## 2019-09-06 DIAGNOSIS — D120 Benign neoplasm of cecum: Secondary | ICD-10-CM | POA: Diagnosis not present

## 2019-09-06 DIAGNOSIS — D125 Benign neoplasm of sigmoid colon: Secondary | ICD-10-CM

## 2019-09-06 DIAGNOSIS — D128 Benign neoplasm of rectum: Secondary | ICD-10-CM | POA: Diagnosis not present

## 2019-09-06 MED ORDER — SODIUM CHLORIDE 0.9 % IV SOLN: 500.0000 mL | Freq: Once | INTRAVENOUS | Status: DC

## 2019-09-06 NOTE — Progress Notes (Signed)
Report to PACU, RN, vss, BBS= Clear.  

## 2019-09-06 NOTE — Progress Notes (Signed)
Dr Burna Mortimer aware of ephedrine

## 2019-09-06 NOTE — Progress Notes (Signed)
Called to room to assist during endoscopic procedure.  Patient ID and intended procedure confirmed with present staff. Received instructions for my participation in the procedure from the performing physician.  

## 2019-09-06 NOTE — Progress Notes (Signed)
VS- Anna Valenzuela  Pt's states no medical or surgical changes since previsit or office visit.  When pt arrives, BP 88/50- she states, "I usually 90/60."   1337- 103/64

## 2019-09-06 NOTE — Op Note (Signed)
[facility] Patient Name: Anna Valenzuela Procedure Date: 09/06/2019 2:54 PM MRN: [id number] Endoscopist: Mauri Pole , MD Age: 64 Referring MD:  Date of Birth: 05-Dec-1955 Gender: Female Account #: [id number] Procedure:                Colonoscopy Indications:              High risk colon cancer surveillance: Personal                            history of colonic polyps, High risk colon cancer                            surveillance: Personal history of multiple (3 or                            more) adenomas, Last colonoscopy: date unknown                            (unable to locate last colonoscopy report) Medicines:                Monitored Anesthesia Care Procedure:                Pre-Anesthesia Assessment:                           - Prior to the procedure, a History and Physical                            was performed, and patient medications and                            allergies were reviewed. The patient's tolerance of                            previous anesthesia was also reviewed. The risks                            and benefits of the procedure and the sedation                            options and risks were discussed with the patient.                            All questions were answered, and informed consent                            was obtained. Prior Anticoagulants: The patient has                            taken no previous anticoagulant or antiplatelet                            agents. ASA Grade Assessment: II - A patient with  mild systemic disease. After reviewing the risks                            and benefits, the patient was deemed in                            satisfactory condition to undergo the procedure.                           After obtaining informed consent, the colonoscope                            was passed under direct vision. Throughout the                            procedure, the patient's  blood pressure, pulse, and                            oxygen saturations were monitored continuously. The                            Colonoscope was introduced through the anus and                            advanced to the the cecum, identified by                            appendiceal orifice and ileocecal valve. The                            colonoscopy was performed without difficulty. The                            patient tolerated the procedure well. The quality                            of the bowel preparation was good. The ileocecal                            valve, appendiceal orifice, and rectum were                            photographed. Scope In: 3:07:14 PM Scope Out: 3:30:41 PM Scope Withdrawal Time: 0 hours 10 minutes 49 seconds  Total Procedure Duration: 0 hours 23 minutes 27 seconds  Findings:                 The perianal and digital rectal examinations were                            normal.                           Three sessile polyps were found in the rectum,  sigmoid colon and cecum. The polyps were 5 to 10 mm                            in size. These polyps were removed with a cold                            snare. Resection and retrieval were complete.                           A less than 1 mm polyp was found in the transverse                            colon. The polyp was sessile. The polyp was removed                            with a cold biopsy forceps. Resection and retrieval                            were complete.                           Non-bleeding internal hemorrhoids were found during                            retroflexion. The hemorrhoids were small. Complications:            No immediate complications. Estimated Blood Loss:     Estimated blood loss was minimal. Impression:               - Three 5 to 10 mm polyps in the rectum, in the                            sigmoid colon and in the cecum, removed with a cold                             snare. Resected and retrieved.                           - One less than 1 mm polyp in the transverse colon,                            removed with a cold biopsy forceps. Resected and                            retrieved.                           - Non-bleeding internal hemorrhoids. Recommendation:           - Patient has a contact number available for                            emergencies. The signs and symptoms of potential  delayed complications were discussed with the                            patient. Return to normal activities tomorrow.                            Written discharge instructions were provided to the                            patient.                           - Resume previous diet.                           - Continue present medications.                           - Await pathology results.                           - Repeat colonoscopy in 3 - 5 years for                            surveillance based on pathology results. Mauri Pole, MD 09/06/2019 3:40:54 PM This report has been signed electronically.

## 2019-09-06 NOTE — Patient Instructions (Signed)
Handout provided on polyps and hemorrhoids.  No aspirin, ibuprofen, naproxen, or other non-steriodal anti-inflammatory drugs for 2 weeks after polyp removal.     YOU HAD AN ENDOSCOPIC PROCEDURE TODAY AT [facility]:   Refer to the procedure report that was given to you for any specific questions about what was found during the examination.  If the procedure report does not answer your questions, please call your gastroenterologist to clarify.  If you requested that your care partner not be given the details of your procedure findings, then the procedure report has been included in a sealed envelope for you to review at your convenience later.  YOU SHOULD EXPECT: Some feelings of bloating in the abdomen. Passage of more gas than usual.  Walking can help get rid of the air that was put into your GI tract during the procedure and reduce the bloating. If you had a lower endoscopy (such as a colonoscopy or flexible sigmoidoscopy) you may notice spotting of blood in your stool or on the toilet paper. If you underwent a bowel prep for your procedure, you may not have a normal bowel movement for a few days.  Please Note:  You might notice some irritation and congestion in your nose or some drainage.  This is from the oxygen used during your procedure.  There is no need for concern and it should clear up in a day or so.  SYMPTOMS TO REPORT IMMEDIATELY:   Following lower endoscopy (colonoscopy or flexible sigmoidoscopy):  Excessive amounts of blood in the stool  Significant tenderness or worsening of abdominal pains  Swelling of the abdomen that is new, acute  Fever of 100F or higher  For urgent or emergent issues, a gastroenterologist can be reached at any hour by calling [phone]. Do not use MyChart messaging for urgent concerns.    DIET:  We do recommend a small meal at first, but then you may proceed to your regular diet.  Drink plenty of fluids but you should avoid  alcoholic beverages for 24 hours.  ACTIVITY:  You should plan to take it easy for the rest of today and you should NOT DRIVE or use heavy machinery until tomorrow (because of the sedation medicines used during the test).    FOLLOW UP: Our staff will call the number listed on your records 48-72 hours following your procedure to check on you and address any questions or concerns that you may have regarding the information given to you following your procedure. If we do not reach you, we will leave a message.  We will attempt to reach you two times.  During this call, we will ask if you have developed any symptoms of COVID 19. If you develop any symptoms (ie: fever, flu-like symptoms, shortness of breath, cough etc.) before then, please call [phone].  If you test positive for Covid 19 in the 2 weeks post procedure, please call and report this information to [place].    If any biopsies were taken you will be contacted by phone or by letter within the next 1-3 weeks.  Please call us at [phone] if you have not heard about the biopsies in 3 weeks.    SIGNATURES/CONFIDENTIALITY: You and/or your care partner have signed paperwork which will be entered into your electronic medical record.  These signatures attest to the fact that that the information above on your After Visit Summary has been reviewed and is understood.  Full responsibility of the confidentiality of this discharge  information lies with you and/or your care-partner.

## 2019-09-08 ENCOUNTER — Telehealth: Payer: Self-pay | Admitting: *Deleted

## 2019-09-08 ENCOUNTER — Telehealth: Payer: Self-pay

## 2019-09-08 NOTE — Telephone Encounter (Signed)
°  Follow up Call-  Call back number 09/06/2019  Post procedure Call Back phone  # [phone]  Permission to leave phone message Yes  Some recent data might be hidden     No answer at 2nd attempt follow up phone call.  Left message on voicemail.

## 2019-09-08 NOTE — Telephone Encounter (Signed)
No answer, left message to call back later today, B.Aliena Ghrist RN. 

## 2019-09-16 ENCOUNTER — Encounter: Payer: Self-pay | Admitting: Gastroenterology

## 2019-10-12 DIAGNOSIS — T7840XA Allergy, unspecified, initial encounter: Secondary | ICD-10-CM | POA: Insufficient documentation

## 2019-10-12 DIAGNOSIS — F419 Anxiety disorder, unspecified: Secondary | ICD-10-CM | POA: Insufficient documentation

## 2019-10-12 DIAGNOSIS — E079 Disorder of thyroid, unspecified: Secondary | ICD-10-CM | POA: Insufficient documentation

## 2019-10-12 DIAGNOSIS — D649 Anemia, unspecified: Secondary | ICD-10-CM | POA: Insufficient documentation

## 2019-10-13 ENCOUNTER — Ambulatory Visit (INDEPENDENT_AMBULATORY_CARE_PROVIDER_SITE_OTHER): Payer: 59 | Admitting: Cardiology

## 2019-10-13 ENCOUNTER — Encounter: Payer: Self-pay | Admitting: Cardiology

## 2019-10-13 ENCOUNTER — Other Ambulatory Visit: Payer: Self-pay

## 2019-10-13 VITALS — BP 92/66 | HR 77 | Ht 61.5 in | Wt 139.4 lb

## 2019-10-13 DIAGNOSIS — R9431 Abnormal electrocardiogram [ECG] [EKG]: Secondary | ICD-10-CM

## 2019-10-13 DIAGNOSIS — R072 Precordial pain: Secondary | ICD-10-CM | POA: Insufficient documentation

## 2019-10-13 DIAGNOSIS — R5383 Other fatigue: Secondary | ICD-10-CM

## 2019-10-13 DIAGNOSIS — R0602 Shortness of breath: Secondary | ICD-10-CM | POA: Insufficient documentation

## 2019-10-13 HISTORY — DX: Abnormal electrocardiogram (ECG) (EKG): R94.31

## 2019-10-13 HISTORY — DX: Precordial pain: R07.2

## 2019-10-13 HISTORY — DX: Other fatigue: R53.83

## 2019-10-13 HISTORY — DX: Shortness of breath: R06.02
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
## 2019-10-13 NOTE — Patient Instructions (Signed)
Medication Instructions:  Your physician recommends that you continue on your current medications as directed. Please refer to the Current Medication list given to you today.  *If you need a refill on your cardiac medications before your next appointment, please call your pharmacy*   Lab Work: TODAY:  VITAMIN D  If you have labs (blood work) drawn today and your tests are completely normal, you will receive your results only by: Marland Kitchen MyChart Message (if you have MyChart) OR . A paper copy in the mail If you have any lab test that is abnormal or we need to change your treatment, we will call you to review the results.   Testing/Procedures: Your physician has requested that you have an echocardiogram. Echocardiography is a painless test that uses sound waves to create images of your heart. It provides your doctor with information about the size and shape of your heart and how well your heart's chambers and valves are working. This procedure takes approximately one hour. There are no restrictions for this procedure.   Your physician has requested that you have a lexiscan myoview. For further information please visit [email or web address]. Please follow instruction sheet, BELOW:  The test will take approximately 3 to 4 hours to complete; you may bring reading material.  If someone comes with you to your appointment, they will need to remain in the main lobby due to limited space in the testing area. **If you are pregnant or breastfeeding, please notify the nuclear lab prior to your appointment**  How to prepare for your Myocardial Perfusion Test: . Do not eat or drink 3 hours prior to your test, except you may have water. . Do not consume products containing caffeine (regular or decaffeinated) 12 hours prior to your test. (ex: coffee, chocolate, sodas, tea). . Do bring a list of your current medications with you.  If not listed below, you may take your medications as normal. . Do wear comfortable  clothes (no dresses or overalls) and walking shoes, tennis shoes preferred (No heels or open toe shoes are allowed). . Do NOT wear cologne, perfume, aftershave, or lotions (deodorant is allowed). . If these instructions are not followed, your test will have to be rescheduled.    Follow-Up: At [facility], you and your health needs are our priority.  As part of our continuing mission to provide you with exceptional heart care, we have created designated Provider Care Teams.  These Care Teams include your primary Cardiologist (physician) and Advanced Practice Providers (APPs -  Physician Assistants and Nurse Practitioners) who all work together to provide you with the care you need, when you need it.  We recommend signing up for the patient portal called "MyChart".  Sign up information is provided on this After Visit Summary.  MyChart is used to connect with patients for Virtual Visits (Telemedicine).  Patients are able to view lab/test results, encounter notes, upcoming appointments, etc.  Non-urgent messages can be sent to your provider as well.   To learn more about what you can do with MyChart, go to [email or web address].    Your next appointment:   3 month(s)  The format for your next appointment:   In Person  Provider:   Berniece Salines, DO   Other Instructions  Echocardiogram An echocardiogram is a procedure that uses painless sound waves (ultrasound) to produce an image of the heart. Images from an echocardiogram can provide important information about:  Signs of coronary artery disease (CAD).  Aneurysm detection. An aneurysm is a weak  or damaged part of an artery wall that bulges out from the normal force of blood pumping through the body.  Heart size and shape. Changes in the size or shape of the heart can be associated with certain conditions, including heart failure, aneurysm, and CAD.  Heart muscle function.  Heart valve function.  Signs of a past heart  attack.  Fluid buildup around the heart.  Thickening of the heart muscle.  A tumor or infectious growth around the heart valves. Tell a health care provider about:  Any allergies you have.  All medicines you are taking, including vitamins, herbs, eye drops, creams, and over-the-counter medicines.  Any blood disorders you have.  Any surgeries you have had.  Any medical conditions you have.  Whether you are pregnant or may be pregnant. What are the risks? Generally, this is a safe procedure. However, problems may occur, including:  Allergic reaction to dye (contrast) that may be used during the procedure. What happens before the procedure? No specific preparation is needed. You may eat and drink normally. What happens during the procedure?   An IV tube may be inserted into one of your veins.  You may receive contrast through this tube. A contrast is an injection that improves the quality of the pictures from your heart.  A gel will be applied to your chest.  A wand-like tool (transducer) will be moved over your chest. The gel will help to transmit the sound waves from the transducer.  The sound waves will harmlessly bounce off of your heart to allow the heart images to be captured in real-time motion. The images will be recorded on a computer. The procedure may vary among health care providers and hospitals. What happens after the procedure?  You may return to your normal, everyday life, including diet, activities, and medicines, unless your health care provider tells you not to do that. Summary  An echocardiogram is a procedure that uses painless sound waves (ultrasound) to produce an image of the heart.  Images from an echocardiogram can provide important information about the size and shape of your heart, heart muscle function, heart valve function, and fluid buildup around your heart.  You do not need to do anything to prepare before this procedure. You may eat and  drink normally.  After the echocardiogram is completed, you may return to your normal, everyday life, unless your health care provider tells you not to do that. This information is not intended to replace advice given to you by your health care provider. Make sure you discuss any questions you have with your health care provider. Document Revised: 04/30/2018 Document Reviewed: 02/10/2016 Elsevier Patient Education  2020 [place].    Cardiac Nuclear Scan A cardiac nuclear scan is a test that measures blood flow to the heart when a person is resting and when he or she is exercising. The test looks for problems such as:  Not enough blood reaching a portion of the heart.  The heart muscle not working normally. You may need this test if:  You have heart disease.  You have had abnormal lab results.  You have had heart surgery or a balloon procedure to open up blocked arteries (angioplasty).  You have chest pain.  You have shortness of breath. In this test, a radioactive dye (tracer) is injected into your bloodstream. After the tracer has traveled to your heart, an imaging device is used to measure how much of the tracer is absorbed by or distributed to various  areas of your heart. This procedure is usually done at a hospital and takes 2-4 hours. Tell a health care provider about:  Any allergies you have.  All medicines you are taking, including vitamins, herbs, eye drops, creams, and over-the-counter medicines.  Any problems you or family members have had with anesthetic medicines.  Any blood disorders you have.  Any surgeries you have had.  Any medical conditions you have.  Whether you are pregnant or may be pregnant. What are the risks? Generally, this is a safe procedure. However, problems may occur, including:  Serious chest pain and heart attack. This is only a risk if the stress portion of the test is done.  Rapid heartbeat.  Sensation of warmth in your chest.  This usually passes quickly.  Allergic reaction to the tracer. What happens before the procedure?  Ask your health care provider about changing or stopping your regular medicines. This is especially important if you are taking diabetes medicines or blood thinners.  Follow instructions from your health care provider about eating or drinking restrictions.  Remove your jewelry on the day of the procedure. What happens during the procedure?  An IV will be inserted into one of your veins.  Your health care provider will inject a small amount of radioactive tracer through the IV.  You will wait for 20-40 minutes while the tracer travels through your bloodstream.  Your heart activity will be monitored with an electrocardiogram (ECG).  You will lie down on an exam table.  Images of your heart will be taken for about 15-20 minutes.  You may also have a stress test. For this test, one of the following may be done: ? You will exercise on a treadmill or stationary bike. While you exercise, your heart's activity will be monitored with an ECG, and your blood pressure will be checked. ? You will be given medicines that will increase blood flow to parts of your heart. This is done if you are unable to exercise.  When blood flow to your heart has peaked, a tracer will again be injected through the IV.  After 20-40 minutes, you will get back on the exam table and have more images taken of your heart.  Depending on the type of tracer used, scans may need to be repeated 3-4 hours later.  Your IV line will be removed when the procedure is over. The procedure may vary among health care providers and hospitals. What happens after the procedure?  Unless your health care provider tells you otherwise, you may return to your normal schedule, including diet, activities, and medicines.  Unless your health care provider tells you otherwise, you may increase your fluid intake. This will help to flush the  contrast dye from your body. Drink enough fluid to keep your urine pale yellow.  Ask your health care provider, or the department that is doing the test: ? When will my results be ready? ? How will I get my results? Summary  A cardiac nuclear scan measures the blood flow to the heart when a person is resting and when he or she is exercising.  Tell your health care provider if you are pregnant.  Before the procedure, ask your health care provider about changing or stopping your regular medicines. This is especially important if you are taking diabetes medicines or blood thinners.  After the procedure, unless your health care provider tells you otherwise, increase your fluid intake. This will help flush the contrast dye from your body.  After the procedure, unless your health care provider tells you otherwise, you may return to your normal schedule, including diet, activities, and medicines. This information is not intended to replace advice given to you by your health care provider. Make sure you discuss any questions you have with your health care provider. Document Revised: 06/23/2017 Document Reviewed: 06/23/2017 Elsevier Patient Education  [place].

## 2019-10-14 ENCOUNTER — Telehealth (HOSPITAL_COMMUNITY): Payer: Self-pay | Admitting: *Deleted

## 2019-10-14 LAB — VITAMIN D 25 HYDROXY (VIT D DEFICIENCY, FRACTURES): Vit D, 25-Hydroxy: 42.8 ng/mL (ref 30.0–100.0)

## 2019-10-14 NOTE — Telephone Encounter (Signed)
Patient given detailed instructions per Myocardial Perfusion Study Information Sheet for the test on 10/20/19 at 11:30. Patient notified to arrive 15 minutes early and that it is imperative to arrive on time for appointment to keep from having the test rescheduled.  If you need to cancel or reschedule your appointment, please call the office within 24 hours of your appointment. . Patient verbalized understanding.Anna Valenzuela

## 2019-11-03 ENCOUNTER — Telehealth (HOSPITAL_COMMUNITY): Payer: Self-pay

## 2019-11-03 NOTE — Telephone Encounter (Signed)
Spoke with the patient, detailed instructions given. She stated that she understood and would be here for test. Asked to call back with any questions. S.Breyson Kelm EMTP

## 2019-11-04 ENCOUNTER — Ambulatory Visit (INDEPENDENT_AMBULATORY_CARE_PROVIDER_SITE_OTHER): Payer: 59

## 2019-11-04 ENCOUNTER — Other Ambulatory Visit: Payer: Self-pay

## 2019-11-04 DIAGNOSIS — R0602 Shortness of breath: Secondary | ICD-10-CM

## 2019-11-04 DIAGNOSIS — R9431 Abnormal electrocardiogram [ECG] [EKG]: Secondary | ICD-10-CM

## 2019-11-04 DIAGNOSIS — R072 Precordial pain: Secondary | ICD-10-CM | POA: Diagnosis not present

## 2019-11-04 LAB — ECHOCARDIOGRAM COMPLETE
Area-P 1/2: 2.95 cm2
S' Lateral: 2.9 cm

## 2019-11-04 NOTE — Progress Notes (Signed)
Complete echocardiogram performed.  Jimmy Caeley Dohrmann RDCS, RVT  

## 2019-11-08 ENCOUNTER — Telehealth: Payer: Self-pay

## 2019-11-08 NOTE — Telephone Encounter (Signed)
Pt verbalized understanding of her Echo results and will keep her follow up in 12/2019.

## 2019-11-08 NOTE — Telephone Encounter (Signed)
Spoke with patient regarding results and recommendation.  Patient verbalizes understanding and is agreeable to plan of care. Advised patient to call back with any issues or concerns.  

## 2019-11-08 NOTE — Telephone Encounter (Signed)
-----   Message from Berniece Salines, DO sent at 11/07/2019  8:02 PM EDT ----- The echo showed that the heart is not fully relaxing like it should ( diastolic dysfunction) ,but otherwise normal. I will discuss it at the next office visit.

## 2019-11-08 NOTE — Telephone Encounter (Signed)
Pt called back in and stated that she did speak with Lilia Pro this morning but she was still half a sleep and did not truly understand the results.  She would like to see if she could speak with Lilia Pro again   Best number - [phone] After 11am is best

## 2020-01-05 ENCOUNTER — Other Ambulatory Visit: Payer: Self-pay | Admitting: Medical

## 2020-01-05 ENCOUNTER — Ambulatory Visit (INDEPENDENT_AMBULATORY_CARE_PROVIDER_SITE_OTHER): Payer: 59 | Admitting: Cardiology

## 2020-01-05 ENCOUNTER — Encounter: Payer: Self-pay | Admitting: Cardiology

## 2020-01-05 ENCOUNTER — Other Ambulatory Visit: Payer: Self-pay

## 2020-01-05 VITALS — BP 102/80 | HR 81 | Ht 61.0 in | Wt 142.8 lb

## 2020-01-05 DIAGNOSIS — Z720 Tobacco use: Secondary | ICD-10-CM

## 2020-01-05 DIAGNOSIS — R079 Chest pain, unspecified: Secondary | ICD-10-CM | POA: Diagnosis not present

## 2020-01-05 DIAGNOSIS — I209 Angina pectoris, unspecified: Secondary | ICD-10-CM

## 2020-01-05 DIAGNOSIS — E782 Mixed hyperlipidemia: Secondary | ICD-10-CM | POA: Insufficient documentation

## 2020-01-05 DIAGNOSIS — E785 Hyperlipidemia, unspecified: Secondary | ICD-10-CM

## 2020-01-05 HISTORY — DX: Mixed hyperlipidemia: E78.2

## 2020-01-05 HISTORY — DX: Angina pectoris, unspecified: I20.9

## 2020-01-05 HISTORY — DX: Tobacco use: Z72.0

## 2020-01-05 MED ORDER — ISOSORBIDE MONONITRATE ER 30 MG PO TB24: 15.0000 mg | ORAL_TABLET | Freq: Every day | ORAL | 3 refills | Status: DC

## 2020-01-05 MED ORDER — NITROGLYCERIN 0.4 MG SL SUBL: 0.4000 mg | SUBLINGUAL_TABLET | SUBLINGUAL | 3 refills | Status: DC | PRN

## 2020-01-05 MED ORDER — ATORVASTATIN CALCIUM 20 MG PO TABS: 20.0000 mg | ORAL_TABLET | Freq: Every day | ORAL | 3 refills | Status: DC
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
## 2020-01-05 NOTE — Patient Instructions (Addendum)
Medication Instructions:  Your physician has recommended you make the following change in your medication:  START: Imdur 15 mg take 0.5 tablet by mouth daily.  START: Lipitor 20 mg take one tablet by mouth daily. START: Nitroglycerin 0.4 mg take one tablet by mouth every 5 minutes up to three times as needed for chest pain.  *If you need a refill on your cardiac medications before your next appointment, please call your pharmacy*   Lab Work: Your physician recommends that you return for lab work in: TODAY CBC, BMP If you have labs (blood work) drawn today and your tests are completely normal, you will receive your results only by: Marland Kitchen MyChart Message (if you have MyChart) OR . A paper copy in the mail If you have any lab test that is abnormal or we need to change your treatment, we will call you to review the results.   Testing/Procedures:    [place] [id number] Dept: [phone] Loc: [facility]  01/05/2020  You are scheduled for a Cardiac Catheterization on Monday, December 27 with Dr. Peter Martinique.  1. Please arrive at the [facility] (Main Entrance A) at [facility]: [address] [place], [place] [address] at 5:30 AM (This time is two hours before your procedure to ensure your preparation). Free valet parking service is available.   Special note: Every effort is made to have your procedure done on time. Please understand that emergencies sometimes delay scheduled procedures.  2. Diet: Do not eat solid foods after midnight.  The patient may have clear liquids until 5am upon the day of the procedure.  3. Labs: You will need to have blood drawn on YOU HAD Lynn 01/05/20  4. Medication instructions in preparation for your procedure:   Contrast Allergy: No  On the morning of your procedure, take your Aspirin and any morning  medicines NOT listed above.  You may use sips of water.  5. Plan for one night stay--bring personal belongings. 6. Bring a current list of your medications and current insurance cards. 7. You MUST have a responsible person to drive you home. 8. Someone MUST be with you the first 24 hours after you arrive home or your discharge will be delayed. 9. Please wear clothes that are easy to get on and off and wear slip-on shoes.  Thank you for allowing [place] to care for you!   -- [facility]    Follow-Up: At [facility], you and your health needs are our priority.  As part of our continuing mission to provide you with exceptional heart care, we have created designated Provider Care Teams.  These Care Teams include your primary Cardiologist (physician) and Advanced Practice Providers (APPs -  Physician Assistants and Nurse Practitioners) who all work together to provide you with the care you need, when you need it.  We recommend signing up for the patient portal called "MyChart".  Sign up information is provided on this After Visit Summary.  MyChart is used to connect with patients for Virtual Visits (Telemedicine).  Patients are able to view lab/test results, encounter notes, upcoming appointments, etc.  Non-urgent messages can be sent to your provider as well.   To learn more about what you can do with MyChart, go to [email or web address].    Your next appointment:   2 week(s)  The format for your next appointment:  In Person  Provider:   Berniece Salines, DO   Other Instructions
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
## 2020-01-06 LAB — BASIC METABOLIC PANEL
BUN/Creatinine Ratio: 14 (ref 12–28)
BUN: 10 mg/dL (ref 8–27)
CO2: 25 mmol/L (ref 20–29)
Calcium: 9.3 mg/dL (ref 8.7–10.3)
Chloride: 103 mmol/L (ref 96–106)
Creatinine, Ser: 0.74 mg/dL (ref 0.57–1.00)
GFR calc Af Amer: 100 mL/min/{1.73_m2} (ref >59)
GFR calc non Af Amer: 86 mL/min/{1.73_m2} (ref >59)
Glucose: 77 mg/dL (ref 65–99)
Potassium: 4.4 mmol/L (ref 3.5–5.2)
Sodium: 140 mmol/L (ref 134–144)

## 2020-01-06 LAB — CBC
Hematocrit: 39.7 % (ref 34.0–46.6)
Hemoglobin: 13.8 g/dL (ref 11.1–15.9)
MCH: 32.7 pg (ref 26.6–33.0)
MCHC: 34.8 g/dL (ref 31.5–35.7)
MCV: 94 fL (ref 79–97)
Platelets: 259 10*3/uL (ref 150–450)
RBC: 4.22 x10E6/uL (ref 3.77–5.28)
RDW: 11.7 % (ref 11.7–15.4)
WBC: 7.6 10*3/uL (ref 3.4–10.8)

## 2020-01-07 ENCOUNTER — Telehealth: Payer: Self-pay

## 2020-01-07 NOTE — Telephone Encounter (Signed)
Left a message to return my call.

## 2020-01-07 NOTE — Telephone Encounter (Signed)
-----   Message from Berniece Salines, DO sent at 01/07/2020 12:40 PM EST ----- Labs normal

## 2020-01-07 NOTE — Telephone Encounter (Signed)
Patient notified of lab results

## 2020-01-13 ENCOUNTER — Telehealth: Payer: Self-pay | Admitting: *Deleted

## 2020-01-13 ENCOUNTER — Other Ambulatory Visit (HOSPITAL_COMMUNITY)
Admission: RE | Admit: 2020-01-13 | Discharge: 2020-01-13 | Disposition: A | Discharge status: Home / Self Care | Payer: 59 | Source: Ambulatory Visit | Attending: Cardiology | Admitting: Cardiology

## 2020-01-13 DIAGNOSIS — Z01812 Encounter for preprocedural laboratory examination: Secondary | ICD-10-CM | POA: Insufficient documentation

## 2020-01-13 DIAGNOSIS — Z20822 Contact with and (suspected) exposure to covid-19: Secondary | ICD-10-CM | POA: Diagnosis not present

## 2020-01-13 LAB — SARS CORONAVIRUS 2 (TAT 6-24 HRS): SARS Coronavirus 2: NEGATIVE

## 2020-01-13 NOTE — Telephone Encounter (Addendum)
Pt contacted pre-catheterization scheduled at [facility] for: Monday January 17, 2020 7:30 AM Verified arrival time and place: [facility]) at: 5:30 AM   No solid food after midnight prior to cath, clear liquids until 5 AM day of procedure.  AM meds can be  taken pre-cath with sips of water including: ASA 81 mg   Confirmed patient has responsible adult to drive home post procedure and be with patient first 24 hours after arriving home: yes  You are allowed ONE visitor in the waiting room during the time you are at the hospital for your procedure. Both you and your visitor must wear a mask once you enter the hospital.       COVID-19 Pre-Screening Questions:  . In the past 14 days have you had any symptoms concerning for COVID-19 infection (fever, chills, cough, or new shortness of breath)? no . In the past 14 days have you been around anyone with known Covid 19? No  Reviewed procedure/mask/visitor instructions, COVID-19 questions reviewed with patient.  Pt knows she does need to quarantine at home after pre-procedure COVID-19 test today until she goes to hospital Monday for procedure.

## 2020-01-17 ENCOUNTER — Encounter (HOSPITAL_COMMUNITY)
Admission: RE | Disposition: A | Discharge status: Home / Self Care | Payer: Self-pay | Source: Home / Self Care | Attending: Cardiology

## 2020-01-17 ENCOUNTER — Other Ambulatory Visit: Payer: Self-pay

## 2020-01-17 ENCOUNTER — Ambulatory Visit (HOSPITAL_COMMUNITY)
Admission: RE | Admit: 2020-01-17 | Discharge: 2020-01-17 | Disposition: A | Discharge status: Home / Self Care | Payer: 59 | Attending: Cardiology | Admitting: Cardiology

## 2020-01-17 ENCOUNTER — Encounter (HOSPITAL_COMMUNITY): Payer: Self-pay | Admitting: Cardiology

## 2020-01-17 DIAGNOSIS — Z8249 Family history of ischemic heart disease and other diseases of the circulatory system: Secondary | ICD-10-CM | POA: Diagnosis not present

## 2020-01-17 DIAGNOSIS — Z720 Tobacco use: Secondary | ICD-10-CM | POA: Diagnosis present

## 2020-01-17 DIAGNOSIS — Z7982 Long term (current) use of aspirin: Secondary | ICD-10-CM | POA: Diagnosis not present

## 2020-01-17 DIAGNOSIS — I209 Angina pectoris, unspecified: Secondary | ICD-10-CM | POA: Diagnosis not present

## 2020-01-17 DIAGNOSIS — F17200 Nicotine dependence, unspecified, uncomplicated: Secondary | ICD-10-CM | POA: Diagnosis present

## 2020-01-17 DIAGNOSIS — Z882 Allergy status to sulfonamides status: Secondary | ICD-10-CM | POA: Insufficient documentation

## 2020-01-17 DIAGNOSIS — R079 Chest pain, unspecified: Secondary | ICD-10-CM | POA: Diagnosis not present

## 2020-01-17 DIAGNOSIS — Z8639 Personal history of other endocrine, nutritional and metabolic disease: Secondary | ICD-10-CM

## 2020-01-17 DIAGNOSIS — Z79899 Other long term (current) drug therapy: Secondary | ICD-10-CM | POA: Diagnosis not present

## 2020-01-17 DIAGNOSIS — E782 Mixed hyperlipidemia: Secondary | ICD-10-CM | POA: Insufficient documentation

## 2020-01-17 DIAGNOSIS — E7800 Pure hypercholesterolemia, unspecified: Secondary | ICD-10-CM | POA: Diagnosis present

## 2020-01-17 DIAGNOSIS — F17210 Nicotine dependence, cigarettes, uncomplicated: Secondary | ICD-10-CM | POA: Diagnosis not present

## 2020-01-17 HISTORY — PX: LEFT HEART CATH AND CORONARY ANGIOGRAPHY: CATH118249

## 2020-01-17 SURGERY — LEFT HEART CATH AND CORONARY ANGIOGRAPHY
Anesthesia: LOCAL

## 2020-01-17 MED ORDER — SODIUM CHLORIDE 0.9 % IV SOLN: INTRAVENOUS | Status: AC

## 2020-01-17 MED ORDER — FENTANYL CITRATE (PF) 100 MCG/2ML IJ SOLN
INTRAMUSCULAR | Status: AC
  Filled 2020-01-17: qty 2

## 2020-01-17 MED ORDER — SODIUM CHLORIDE 0.9% FLUSH: 3.0000 mL | INTRAVENOUS | Status: DC | PRN

## 2020-01-17 MED ORDER — HEPARIN SODIUM (PORCINE) 1000 UNIT/ML IJ SOLN
INTRAMUSCULAR | Status: DC | PRN
  Administered 2020-01-17: 3500 [IU] via INTRAVENOUS

## 2020-01-17 MED ORDER — SODIUM CHLORIDE 0.9% FLUSH: 3.0000 mL | Freq: Two times a day (BID) | INTRAVENOUS | Status: DC

## 2020-01-17 MED ORDER — MIDAZOLAM HCL 2 MG/2ML IJ SOLN
INTRAMUSCULAR | Status: AC
  Filled 2020-01-17: qty 2

## 2020-01-17 MED ORDER — MIDAZOLAM HCL 2 MG/2ML IJ SOLN
INTRAMUSCULAR | Status: DC | PRN
  Administered 2020-01-17: 1 mg via INTRAVENOUS

## 2020-01-17 MED ORDER — SODIUM CHLORIDE 0.9 % IV BOLUS: 250.0000 mL | Freq: Once | INTRAVENOUS | Status: DC

## 2020-01-17 MED ORDER — HEPARIN SODIUM (PORCINE) 1000 UNIT/ML IJ SOLN
INTRAMUSCULAR | Status: AC
  Filled 2020-01-17: qty 1

## 2020-01-17 MED ORDER — LIDOCAINE HCL (PF) 1 % IJ SOLN
INTRAMUSCULAR | Status: AC
  Filled 2020-01-17: qty 30

## 2020-01-17 MED ORDER — FENTANYL CITRATE (PF) 100 MCG/2ML IJ SOLN
INTRAMUSCULAR | Status: DC | PRN
  Administered 2020-01-17: 25 ug via INTRAVENOUS

## 2020-01-17 MED ORDER — SODIUM CHLORIDE 0.9 % IV SOLN
250.0000 mL | INTRAVENOUS | Status: DC | PRN
Start: 2020-01-17 — End: 2020-01-17

## 2020-01-17 MED ORDER — SODIUM CHLORIDE 0.9% FLUSH
3.0000 mL | INTRAVENOUS | Status: DC | PRN
Start: 2020-01-17 — End: 2020-01-17

## 2020-01-17 MED ORDER — SODIUM CHLORIDE 0.9 % WEIGHT BASED INFUSION
1.0000 mL/kg/h | INTRAVENOUS | Status: DC
  Administered 2020-01-17: 09:00:00 500 mL via INTRAVENOUS

## 2020-01-17 MED ORDER — VERAPAMIL HCL 2.5 MG/ML IV SOLN
INTRAVENOUS | Status: AC
  Filled 2020-01-17: qty 2

## 2020-01-17 MED ORDER — ACETAMINOPHEN 325 MG PO TABS: 650.0000 mg | ORAL_TABLET | ORAL | Status: DC | PRN

## 2020-01-17 MED ORDER — SODIUM CHLORIDE 0.9 % WEIGHT BASED INFUSION
3.0000 mL/kg/h | INTRAVENOUS | Status: AC
  Administered 2020-01-17: 08:00:00 3 mL/kg/h via INTRAVENOUS

## 2020-01-17 MED ORDER — VERAPAMIL HCL 2.5 MG/ML IV SOLN: INTRAVENOUS | Status: DC | PRN

## 2020-01-17 MED ORDER — IOHEXOL 350 MG/ML SOLN
INTRAVENOUS | Status: DC | PRN
  Administered 2020-01-17: 09:00:00 45 mL

## 2020-01-17 MED ORDER — HEPARIN (PORCINE) IN NACL 1000-0.9 UT/500ML-% IV SOLN
INTRAVENOUS | Status: AC
  Filled 2020-01-17: qty 1000

## 2020-01-17 MED ORDER — LIDOCAINE HCL (PF) 1 % IJ SOLN
INTRAMUSCULAR | Status: DC | PRN
  Administered 2020-01-17: 2 mL

## 2020-01-17 MED ORDER — HEPARIN (PORCINE) IN NACL 1000-0.9 UT/500ML-% IV SOLN
INTRAVENOUS | Status: DC | PRN
  Administered 2020-01-17: 500 mL

## 2020-01-17 MED ORDER — ASPIRIN 81 MG PO CHEW: 81.0000 mg | CHEWABLE_TABLET | ORAL | Status: DC

## 2020-01-17 MED ORDER — SODIUM CHLORIDE 0.9 % IV SOLN: 250.0000 mL | INTRAVENOUS | Status: DC | PRN

## 2020-01-17 MED ORDER — ONDANSETRON HCL 4 MG/2ML IJ SOLN: 4.0000 mg | Freq: Four times a day (QID) | INTRAMUSCULAR | Status: DC | PRN

## 2020-01-17 SURGICAL SUPPLY — 10 items
CATH 5FR JL3.5 JR4 ANG PIG MP (CATHETERS) ×1 IMPLANT
DEVICE RAD COMP TR BAND LRG (VASCULAR PRODUCTS) ×1 IMPLANT
GLIDESHEATH SLEND SS 6F .021 (SHEATH) ×1 IMPLANT
GUIDEWIRE INQWIRE 1.5J.035X260 (WIRE) IMPLANT
INQWIRE 1.5J .035X260CM (WIRE) ×2
KIT HEART LEFT (KITS) ×2 IMPLANT
PACK CARDIAC CATHETERIZATION (CUSTOM PROCEDURE TRAY) ×2 IMPLANT
SHEATH PROBE COVER 6X72 (BAG) ×1 IMPLANT
TRANSDUCER W/STOPCOCK (MISCELLANEOUS) ×2 IMPLANT
TUBING CIL FLEX 10 FLL-RA (TUBING) ×2 IMPLANT

## 2020-01-17 NOTE — Discharge Instructions (Signed)
Drink plenty of fluids for 48 hours and keep wrist elevated at heart level for 24 hours  Radial Site Care   This sheet gives you information about how to care for yourself after your procedure. Your health care provider may also give you more specific instructions. If you have problems or questions, contact your health care provider. What can I expect after the procedure? After the procedure, it is common to have:  Bruising and tenderness at the catheter insertion area. Follow these instructions at home: Medicines  Take over-the-counter and prescription medicines only as told by your health care provider. Insertion site care 1. Follow instructions from your health care provider about how to take care of your insertion site. Make sure you: ? Wash your hands with soap and water before you change your bandage (dressing). If soap and water are not available, use hand sanitizer. ? Remove your dressing as told by your health care provider. In 24 hours 2. Check your insertion site every day for signs of infection. Check for: ? Redness, swelling, or pain. ? Fluid or blood. ? Pus or a bad smell. ? Warmth. 3. Do not take baths, swim, or use a hot tub until your health care provider approves. 4. You may shower 24-48 hours after the procedure, or as directed by your health care provider. ? Remove the dressing and gently wash the site with plain soap and water. ? Pat the area dry with a clean towel. ? Do not rub the site. That could cause bleeding. 5. Do not apply powder or lotion to the site. Activity   1. For 24 hours after the procedure, or as directed by your health care provider: ? Do not flex or bend the affected arm. ? Do not push or pull heavy objects with the affected arm. ? Do not drive yourself home from the hospital or clinic. You may drive 24 hours after the procedure unless your health care provider tells you not to. ? Do not operate machinery or power tools. 2. Do not lift  anything that is heavier than 10 lb (4.5 kg), or the limit that you are told, until your health care provider says that it is safe.  For 4 days 3. Ask your health care provider when it is okay to: ? Return to work or school. ? Resume usual physical activities or sports. ? Resume sexual activity. General instructions  If the catheter site starts to bleed, raise your arm and put firm pressure on the site. If the bleeding does not stop, get help right away. This is a medical emergency.  If you went home on the same day as your procedure, a responsible adult should be with you for the first 24 hours after you arrive home.  Keep all follow-up visits as told by your health care provider. This is important. Contact a health care provider if:  You have a fever.  You have redness, swelling, or yellow drainage around your insertion site. Get help right away if:  You have unusual pain at the radial site.  The catheter insertion area swells very fast.  The insertion area is bleeding, and the bleeding does not stop when you hold steady pressure on the area.  Your arm or hand becomes pale, cool, tingly, or numb. These symptoms may represent a serious problem that is an emergency. Do not wait to see if the symptoms will go away. Get medical help right away. Call your local emergency services (911 in the [place] Do   not drive yourself to the hospital. Summary  After the procedure, it is common to have bruising and tenderness at the site.  Follow instructions from your health care provider about how to take care of your radial site wound. Check the wound every day for signs of infection.  Do not lift anything that is heavier than 10 lb (4.5 kg), or the limit that you are told, until your health care provider says that it is safe. This information is not intended to replace advice given to you by your health care provider. Make sure you discuss any questions you have with your health care  provider. Document Revised: 02/12/2017 Document Reviewed: 02/12/2017 Elsevier Patient Education  2020 [employer]  

## 2020-01-17 NOTE — Interval H&P Note (Signed)
History and Physical Interval Note:  01/17/2020 9:01 AM  Kem Boroughs Carchi  has presented today for surgery, with the diagnosis of cad - chest pain.  The various methods of treatment have been discussed with the patient and family. After consideration of risks, benefits and other options for treatment, the patient has consented to  Procedure(s): LEFT HEART CATH AND CORONARY ANGIOGRAPHY (N/A) as a surgical intervention.  The patient's history has been reviewed, patient examined, no change in status, stable for surgery.  I have reviewed the patient's chart and labs.  Questions were answered to the patient's satisfaction.   Cath Lab Visit (complete for each Cath Lab visit)  Clinical Evaluation Leading to the Procedure:   ACS: No.  Non-ACS:    Anginal Classification: CCS III  Anti-ischemic medical therapy: No Therapy  Non-Invasive Test Results: No non-invasive testing performed  Prior CABG: No previous CABG        Theron Arista [facility] 01/17/2020 9:02 AM

## 2020-01-25 ENCOUNTER — Ambulatory Visit: Payer: 59 | Admitting: Cardiology

## 2020-01-27 ENCOUNTER — Telehealth (HOSPITAL_COMMUNITY): Payer: Self-pay | Admitting: *Deleted

## 2020-01-27 NOTE — Telephone Encounter (Signed)
Patient given detailed instructions per Myocardial Perfusion Study Information Sheet for the test on 11/23/20 at 11:00. Patient notified to arrive 15 minutes early and that it is imperative to arrive on time for appointment to keep from having the test rescheduled.  If you need to cancel or reschedule your appointment, please call the office within 24 hours of your appointment. . Patient verbalized understanding.Hideko Esselman S   

## 2020-02-01 ENCOUNTER — Ambulatory Visit: Payer: 59 | Admitting: Cardiology

## 2020-02-04 ENCOUNTER — Encounter: Payer: Self-pay | Admitting: Cardiology

## 2020-02-04 ENCOUNTER — Ambulatory Visit (INDEPENDENT_AMBULATORY_CARE_PROVIDER_SITE_OTHER): Payer: 59 | Admitting: Cardiology

## 2020-02-04 ENCOUNTER — Other Ambulatory Visit: Payer: Self-pay

## 2020-02-04 VITALS — BP 128/62 | HR 80 | Ht 61.0 in | Wt 146.4 lb

## 2020-02-04 DIAGNOSIS — Z720 Tobacco use: Secondary | ICD-10-CM

## 2020-02-04 DIAGNOSIS — E782 Mixed hyperlipidemia: Secondary | ICD-10-CM

## 2020-02-04 DIAGNOSIS — R0789 Other chest pain: Secondary | ICD-10-CM | POA: Diagnosis not present
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
## 2020-02-04 NOTE — Patient Instructions (Signed)
Medication Instructions:  Your physician has recommended you make the following change in your medication:  STOP: Aspirin  *If you need a refill on your cardiac medications before your next appointment, please call your pharmacy*   Lab Work: None If you have labs (blood work) drawn today and your tests are completely normal, you will receive your results only by: Marland Kitchen MyChart Message (if you have MyChart) OR . A paper copy in the mail If you have any lab test that is abnormal or we need to change your treatment, we will call you to review the results.   Testing/Procedures: None   Follow-Up: At [facility], you and your health needs are our priority.  As part of our continuing mission to provide you with exceptional heart care, we have created designated Provider Care Teams.  These Care Teams include your primary Cardiologist (physician) and Advanced Practice Providers (APPs -  Physician Assistants and Nurse Practitioners) who all work together to provide you with the care you need, when you need it.  We recommend signing up for the patient portal called "MyChart".  Sign up information is provided on this After Visit Summary.  MyChart is used to connect with patients for Virtual Visits (Telemedicine).  Patients are able to view lab/test results, encounter notes, upcoming appointments, etc.  Non-urgent messages can be sent to your provider as well.   To learn more about what you can do with MyChart, go to [email or web address].    Your next appointment:   6 month(s)  The format for your next appointment:   In Person  Provider:   Berniece Salines, DO   Other Instructions

## 2020-07-31 ENCOUNTER — Other Ambulatory Visit: Payer: Self-pay

## 2020-07-31 DIAGNOSIS — M810 Age-related osteoporosis without current pathological fracture: Secondary | ICD-10-CM | POA: Insufficient documentation

## 2020-08-01 ENCOUNTER — Other Ambulatory Visit: Payer: Self-pay

## 2020-08-01 ENCOUNTER — Encounter: Payer: Self-pay | Admitting: Cardiology

## 2020-08-01 ENCOUNTER — Ambulatory Visit (INDEPENDENT_AMBULATORY_CARE_PROVIDER_SITE_OTHER): Payer: 59 | Admitting: Cardiology

## 2020-08-01 VITALS — BP 92/70 | HR 76 | Ht 61.0 in | Wt 145.0 lb

## 2020-08-01 DIAGNOSIS — R5383 Other fatigue: Secondary | ICD-10-CM

## 2020-08-01 DIAGNOSIS — Z720 Tobacco use: Secondary | ICD-10-CM

## 2020-08-01 DIAGNOSIS — R0683 Snoring: Secondary | ICD-10-CM | POA: Diagnosis not present

## 2020-08-01 DIAGNOSIS — R400 Somnolence: Secondary | ICD-10-CM

## 2020-08-01 NOTE — Patient Instructions (Signed)
Medication Instructions:  Your physician recommends that you continue on your current medications as directed. Please refer to the Current Medication list given to you today.  *If you need a refill on your cardiac medications before your next appointment, please call your pharmacy*   Lab Work: Your physician recommends that you return for lab work in:  TODAY: Vitamin D  If you have labs (blood work) drawn today and your tests are completely normal, you will receive your results only by: [place] (if you have MyChart) OR A paper copy in the mail If you have any lab test that is abnormal or we need to change your treatment, we will call you to review the results.   Testing/Procedures: Your physician has recommended that you have a sleep study. This test records several body functions during sleep, including: brain activity, eye movement, oxygen and carbon dioxide blood levels, heart rate and rhythm, breathing rate and rhythm, the flow of air through your mouth and nose, snoring, body muscle movements, and chest and belly movement.    Follow-Up: At [facility], you and your health needs are our priority.  As part of our continuing mission to provide you with exceptional heart care, we have created designated Provider Care Teams.  These Care Teams include your primary Cardiologist (physician) and Advanced Practice Providers (APPs -  Physician Assistants and Nurse Practitioners) who all work together to provide you with the care you need, when you need it.  We recommend signing up for the patient portal called "MyChart".  Sign up information is provided on this After Visit Summary.  MyChart is used to connect with patients for Virtual Visits (Telemedicine).  Patients are able to view lab/test results, encounter notes, upcoming appointments, etc.  Non-urgent messages can be sent to your provider as well.   To learn more about what you can do with MyChart, go to [email or web address].     Your next appointment:   1 year(s)  The format for your next appointment:   In Person  Provider:   [address] - Berniece Salines, DO    Other Instructions Sleep Study, Adult A sleep study (polysomnogram) is a series of tests done while you are sleeping. A sleep study records your brain waves, heart rate, breathing rate, oxygen level, and eye and legmovements. A sleep study helps your health care provider: See how well you sleep. Diagnose a sleep disorder. Determine how severe your sleep disorder is. Create a plan to treat your sleep disorder. Your health care provider may recommend a sleep study if you: Feel sleepy on most days. Snore loudly while sleeping. Have unusual behaviors while you sleep, such as walking. Have brief periods in which you stop breathing during sleep (sleepapnea). Fall asleep suddenly during the day (narcolepsy). Have trouble falling asleep or staying asleep (insomnia). Feel like you need to move your legs when trying to fall asleep (restless legs syndrome). Move your legs by flexing and extending them regularly while asleep (periodic limb movement disorder). Act out your dreams while you sleep (sleep behavior disorder). Feel like you cannot move when you first wake up (sleep paralysis). What tests are part of a sleep study? Most sleep studies record the following during sleep: Brain activity. Eye movements. Heart rate and rhythm. Breathing rate and rhythm. Blood-oxygen level. Blood pressure. Chest and belly movement as you breathe. Arm and leg movements. Snoring or other noises. Body position. Where are sleep studies done? Sleep studies are done at sleep centers. A sleep center  may be inside [place], office, or clinic. The room where you have the study may look like a hospital room or a hotel room. The health care providers doing the study may come in and out of the room during the study. Most of the time, they will be in another room  monitoringyour test as you sleep. How are sleep studies done? Most sleep studies are done during a normal period of time for a full night of sleep. You will arrive at the study center in the evening and go home in themorning. Before the test Bring your pajamas and toothbrush with you to the sleep study. Do not have caffeine on the day of your sleep study. Do not drink alcohol on the day of your sleep study. Your health care provider will let you know if you should stop taking any of your regular medicines before the test. During the test     Round, sticky patches with sensors attached to recording wires (electrodes) are placed on your scalp, face, chest, and limbs. Wires from all the electrodes and sensors run from your bed to a computer. The wires can be taken off and put back on if you need to get out of bed to go to the bathroom. A sensor is placed over your nose to measure airflow. A finger clip is put on your finger or ear to measure your blood oxygen level (pulse oximetry). A belt is placed around your belly and a belt is placed around your chest to measure breathing movements. If you have signs of the sleep disorder called sleep apnea during your test, you may get a treatment mask to wear for the second half of the night. The mask provides positive airway pressure (PAP) to help you breathe better during sleep. This may greatly improve your sleep apnea. You will then have all tests done again with the mask in place to see if your measurements and recordings change. After the test A medical doctor who specializes in sleep will evaluate the results of your sleep study and share them with you and your primary health care provider. Based on your results, your medical history, and a physical exam, you may be diagnosed with a sleep disorder, such as: Sleep apnea. Restless legs syndrome. Sleep-related behavior disorder. Sleep-related movement disorders. Sleep-related seizure  disorders. Your health care team will help determine your treatment options based on your diagnosis. This may include: Improving your sleep habits (sleep hygiene). Wearing a continuous positive airway pressure (CPAP) or bi-level positive airway pressure (BPAP) mask. Wearing an oral device at night to improve breathing and reduce snoring. Taking medicines. Follow these instructions at home: Take over-the-counter and prescription medicines only as told by your health care provider. If you are instructed to use a CPAP or BPAP mask, make sure you use it nightly as directed. Make any lifestyle changes that your health care provider recommends. If you were given a device to open your airway while you sleep, use it only as told by your health care provider. Do not use any tobacco products, such as cigarettes, chewing tobacco, and e-cigarettes. If you need help quitting, ask your health care provider. Keep all follow-up visits as told by your health care provider. This is important. Summary A sleep study (polysomnogram) is a series of tests done while you are sleeping. It shows how well you sleep. Most sleep studies are done over one full night of sleep. You will arrive at the study center in the evening and go  home in the morning. If you have signs of the sleep disorder called sleep apnea during your test, you may get a treatment mask to wear for the second half of the night. A medical doctor who specializes in sleep will evaluate the results of your sleep study and share them with your primary health care provider. This information is not intended to replace advice given to you by your health care provider. Make sure you discuss any questions you have with your healthcare provider. Document Revised: 02/12/2019 Document Reviewed: 02/04/2017 Elsevier Patient Education  2022 [employer].
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
## 2020-08-02 LAB — VITAMIN D 25 HYDROXY (VIT D DEFICIENCY, FRACTURES): Vit D, 25-Hydroxy: 32.3 ng/mL (ref 30.0–100.0)

## 2020-08-04 ENCOUNTER — Telehealth: Payer: Self-pay | Admitting: *Deleted

## 2020-08-04 NOTE — Telephone Encounter (Signed)
Prior Authorization for split night sleep study sent to [facility] via web portal. Tracking Number [id number].

## 2020-08-04 NOTE — Telephone Encounter (Signed)
-----   Message from Orvan July, RN sent at 08/03/2020  6:28 PM EDT ----- Needs precert for sleep study.  Thank you,    [facility]

## 2020-08-15 ENCOUNTER — Other Ambulatory Visit: Payer: Self-pay

## 2020-08-15 DIAGNOSIS — R0683 Snoring: Secondary | ICD-10-CM

## 2020-08-15 DIAGNOSIS — R5383 Other fatigue: Secondary | ICD-10-CM

## 2020-08-15 DIAGNOSIS — R400 Somnolence: Secondary | ICD-10-CM

## 2020-08-15 NOTE — Telephone Encounter (Signed)
Patient made aware her home sleep study will be August 29th ay 10 am. She is aware the facility will be sending her a packet in the mail. She verbalized understanding.

## 2020-08-15 NOTE — Telephone Encounter (Signed)
Received  a denial from [facility] for in lab sleep study. Ordering provider can call [phone] for peer to peer or the order can be switched to a HST and submitted for approval. If peer to peer is done please use reference # [id number].

## 2020-08-15 NOTE — Progress Notes (Signed)
Home sleep study order placed

## 2020-08-29 ENCOUNTER — Telehealth: Payer: Self-pay | Admitting: *Deleted

## 2020-08-29 NOTE — Telephone Encounter (Signed)
Prior Authorization for HST sent to [facility] via web portal. Tracking Number [id number].

## 2020-09-18 ENCOUNTER — Encounter (HOSPITAL_BASED_OUTPATIENT_CLINIC_OR_DEPARTMENT_OTHER): Payer: 59 | Admitting: Cardiovascular Disease

## 2020-10-18 ENCOUNTER — Other Ambulatory Visit: Payer: Self-pay

## 2020-10-18 ENCOUNTER — Ambulatory Visit (HOSPITAL_BASED_OUTPATIENT_CLINIC_OR_DEPARTMENT_OTHER): Payer: 59 | Attending: Cardiology | Admitting: Cardiovascular Disease

## 2020-10-18 DIAGNOSIS — R0683 Snoring: Secondary | ICD-10-CM | POA: Diagnosis not present

## 2020-10-18 DIAGNOSIS — R0902 Hypoxemia: Secondary | ICD-10-CM | POA: Diagnosis not present

## 2020-10-18 DIAGNOSIS — G4733 Obstructive sleep apnea (adult) (pediatric): Secondary | ICD-10-CM

## 2020-10-18 DIAGNOSIS — R400 Somnolence: Secondary | ICD-10-CM

## 2020-10-18 DIAGNOSIS — R5383 Other fatigue: Secondary | ICD-10-CM | POA: Diagnosis not present

## 2020-11-08 ENCOUNTER — Encounter (HOSPITAL_BASED_OUTPATIENT_CLINIC_OR_DEPARTMENT_OTHER): Payer: Self-pay | Admitting: Cardiovascular Disease

## 2020-11-08 NOTE — Procedures (Signed)
     Patient Name: Anna Valenzuela, Anna Valenzuela Date: 10/19/2020 Gender: Female D.O.B: 1956/01/14 Age (years): 65 Referring Provider: Godfrey Pick Tobb DO Height (inches): 61 Interpreting Physician: Shelva Majestic MD, ABSM Weight (lbs): 145 RPSGT: Jacolyn Reedy BMI: 27 MRN: [id number] Neck Size: <br>  CLINICAL INFORMATION Sleep Study Type: HST  Indication for sleep study: snoring, daytime sleepiness, fatigue  Epworth Sleepiness Score: 14  SLEEP STUDY TECHNIQUE A multi-channel overnight portable sleep study was performed. The channels recorded were: nasal airflow, thoracic respiratory movement, and oxygen saturation with a pulse oximetry. Snoring was also monitored.  MEDICATIONS acetaminophen (TYLENOL) 325 MG tablet albuterol (VENTOLIN HFA) 108 (90 Base) MCG/ACT inhaler aspirin EC 81 MG tablet atorvastatin (LIPITOR) 20 MG tablet B Complex-C (SUPER B-COMPLEX + VITAMIN C PO) BIOTIN PO Calcium-Magnesium-Zinc (CAL-MAG-ZINC PO) gabapentin (NEURONTIN) 100 MG capsule ibuprofen (ADVIL) 800 MG tablet LORazepam (ATIVAN) 1 MG tablet Melatonin 10 MG TABS methocarbamol (ROBAXIN) 750 MG tablet Misc Natural Products (ELDERBERRY ZINC/VIT C/IMMUNE MT) Multiple Vitamin (MULTIVITAMIN PO) nitroGLYCERIN (NITROSTAT) 0.4 MG SL tablet omega-3 acid ethyl esters (LOVAZA) 1 g capsule Potassium Gluconate 550 MG TABS vitamin B-12 (CYANOCOBALAMIN) 1000 MCG tablet VITAMIN D PO Patient self administered medications include: N/A.  SLEEP ARCHITECTURE Patient was studied for 389.8 minutes. The sleep efficiency was 100.0 % and the patient was supine for 52.3%. The arousal index was 0.0 per hour.  RESPIRATORY PARAMETERS The overall AHI was 36.9 per hour, with a central apnea index of 0 per hour. There was a significant positional component with supine AHI 60.1/h versus non-suopine AHI 11.6/h.  The oxygen nadir was 74% during sleep.  CARDIAC DATA Mean heart rate during sleep was 73.0 bpm.  IMPRESSIONS -  Severe obstructive sleep apnea occurred during this study (AHI 36.9/h). - Severe oxygen desaturation to a nadir of 74%. Time spent < 89% was 27 minutes. - Patient snored 8.1% during the sleep.  DIAGNOSIS - Obstructive Sleep Apnea (G47.33) - Nocturnal Hypoxemia (G47.36)  RECOMMENDATIONS - Recomend a therapeutic CPAP titration study, preferably in-lab due to the severity of her sleep disordered breathing. If unable to schedule an in-lab titration then initiate Auto-PAP with EPR of 3 at 7 - 18 cm of water.  - Effort should be made to optimize nasal and oropharyngeal patency. - The patient should be advised to avoid supine sleep; consider positional therapy avoiding supine position during sleep. - Avoid alcohol, sedatives and other CNS depressants that may worsen sleep apnea and disrupt normal sleep architecture. - Sleep hygiene should be reviewed to assess factors that may improve sleep quality. - Weight management and regular exercise should be initiated or continued. - Recommend a download and sleep clinic evaluation after 4 weeks of therapy.    [Electronically signed] 11/08/2020 06:57 PM  Shelva Majestic MD, [facility], [place], American Board of Sleep Medicine   NPI: [id number]  [place] PH: [phone]   FX: [phone] [place]

## 2020-11-16 ENCOUNTER — Telehealth: Payer: Self-pay | Admitting: *Deleted

## 2020-11-16 NOTE — Telephone Encounter (Signed)
-----   Message from Troy Sine, MD sent at 11/08/2020  7:02 PM EDT ----- Mariann Laster, please notify pt of her severe sleep apnea. Try for in-lab titration, if unable then Auto-PAP

## 2020-11-16 NOTE — Telephone Encounter (Signed)
Patient notified of HST results and recommendations. She agrees to proceed with CPAP titration.  Prior Authorization for CPAP titration sent to [facility] via web portal. Tracking Number [id number].

## 2020-11-20 NOTE — Telephone Encounter (Signed)
Informed patient notified insurance denied CPAP titration study. Order for APAP has been sent to [facility].

## 2021-01-05 ENCOUNTER — Encounter: Payer: Self-pay | Admitting: Gastroenterology

## 2021-01-05 ENCOUNTER — Ambulatory Visit (INDEPENDENT_AMBULATORY_CARE_PROVIDER_SITE_OTHER): Payer: 59 | Admitting: Gastroenterology

## 2021-01-05 VITALS — BP 90/66 | HR 84 | Ht 60.25 in | Wt 145.5 lb

## 2021-01-05 DIAGNOSIS — R1310 Dysphagia, unspecified: Secondary | ICD-10-CM | POA: Diagnosis not present

## 2021-01-05 DIAGNOSIS — R1013 Epigastric pain: Secondary | ICD-10-CM

## 2021-01-05 DIAGNOSIS — K219 Gastro-esophageal reflux disease without esophagitis: Secondary | ICD-10-CM | POA: Diagnosis not present

## 2021-01-05 DIAGNOSIS — R142 Eructation: Secondary | ICD-10-CM | POA: Diagnosis not present

## 2021-01-05 MED ORDER — OMEPRAZOLE 20 MG PO CPDR: 20.0000 mg | DELAYED_RELEASE_CAPSULE | Freq: Every day | ORAL | 3 refills | Status: DC
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
## 2021-01-05 NOTE — Patient Instructions (Signed)
You have been scheduled for an abdominal ultrasound at [facility] (1st floor of hospital) on 01/18/2021 at 8am. Please arrive 15 minutes prior to your appointment for registration. Make certain not to have anything to eat or drink 6 hours prior to your appointment. Should you need to reschedule your appointment, please contact radiology at [phone]. This test typically takes about 30 minutes to perform.   Follow up in 3 months    You have been scheduled for an endoscopy. Please follow written instructions given to you at your visit today. If you use inhalers (even only as needed), please bring them with you on the day of your procedure.   We will send Omeprazole to your pharmacy  If you are age 55 or older, your body mass index should be between 23-30. Your Body mass index is 28.18 kg/m. If this is out of the aforementioned range listed, please consider follow up with your Primary Care Provider.  If you are age 16 or younger, your body mass index should be between 19-25. Your Body mass index is 28.18 kg/m. If this is out of the aformentioned range listed, please consider follow up with your Primary Care Provider.   ________________________________________________________  The [facility] providers would like to encourage you to use [facility] to communicate with providers for non-urgent requests or questions.  Due to long hold times on the telephone, sending your provider a message by [facility] may be a faster and more efficient way to get a response.  Please allow 48 business hours for a response.  Please remember that this is for non-urgent requests.  _______________________________________________________  Due to recent changes in healthcare laws, you may see the results of your imaging and laboratory studies on MyChart before your provider has had a chance to review them.  We understand that in some cases there may be results that are confusing or concerning to you. Not all laboratory  results come back in the same time frame and the provider may be waiting for multiple results in order to interpret others.  Please give [place] 48 hours in order for your provider to thoroughly review all the results before contacting the office for clarification of your results.    I appreciate the  opportunity to care for you  Thank You   Harl Bowie , MD

## 2021-01-10 ENCOUNTER — Other Ambulatory Visit: Payer: Self-pay

## 2021-01-10 ENCOUNTER — Encounter: Payer: Self-pay | Admitting: Internal Medicine

## 2021-01-10 ENCOUNTER — Ambulatory Visit (AMBULATORY_SURGERY_CENTER): Payer: 59 | Admitting: Internal Medicine

## 2021-01-10 VITALS — BP 103/63 | HR 72 | Temp 97.3°F | Resp 18 | Ht 60.25 in | Wt 145.0 lb

## 2021-01-10 DIAGNOSIS — K3189 Other diseases of stomach and duodenum: Secondary | ICD-10-CM

## 2021-01-10 DIAGNOSIS — K2970 Gastritis, unspecified, without bleeding: Secondary | ICD-10-CM | POA: Diagnosis not present

## 2021-01-10 DIAGNOSIS — K222 Esophageal obstruction: Secondary | ICD-10-CM | POA: Diagnosis not present

## 2021-01-10 DIAGNOSIS — K449 Diaphragmatic hernia without obstruction or gangrene: Secondary | ICD-10-CM

## 2021-01-10 DIAGNOSIS — K2980 Duodenitis without bleeding: Secondary | ICD-10-CM | POA: Diagnosis not present

## 2021-01-10 DIAGNOSIS — R1013 Epigastric pain: Secondary | ICD-10-CM

## 2021-01-10 DIAGNOSIS — R1310 Dysphagia, unspecified: Secondary | ICD-10-CM | POA: Diagnosis not present

## 2021-01-10 MED ORDER — SODIUM CHLORIDE 0.9 % IV SOLN
500.0000 mL | Freq: Once | INTRAVENOUS | Status: DC
Start: 2021-01-10 — End: 2021-01-10

## 2021-01-10 MED ORDER — OMEPRAZOLE 40 MG PO CPDR: 40.0000 mg | DELAYED_RELEASE_CAPSULE | Freq: Two times a day (BID) | ORAL | 2 refills | Status: DC

## 2021-01-10 NOTE — Progress Notes (Signed)
Called to room to assist during endoscopic procedure.  Patient ID and intended procedure confirmed with present staff. Received instructions for my participation in the procedure from the performing physician.  

## 2021-01-10 NOTE — Op Note (Addendum)
[facility] Patient Name: Anna Valenzuela Procedure Date: 01/10/2021 10:11 AM MRN: [id number] Endoscopist: Sonny Masters "Anna Valenzuela ,  Age: 65 Referring MD:  Date of Birth: 10/20/1955 Gender: Female Account #: [id number] Procedure:                Upper GI endoscopy Indications:              Epigastric abdominal pain, Dysphagia Medicines:                Monitored Anesthesia Care Procedure:                Pre-Anesthesia Assessment:                           - Prior to the procedure, a History and Physical                            was performed, and patient medications and                            allergies were reviewed. The patient's tolerance of                            previous anesthesia was also reviewed. The risks                            and benefits of the procedure and the sedation                            options and risks were discussed with the patient.                            All questions were answered, and informed consent                            was obtained. Prior Anticoagulants: The patient has                            taken no previous anticoagulant or antiplatelet                            agents. ASA Grade Assessment: III - A patient with                            severe systemic disease. After reviewing the risks                            and benefits, the patient was deemed in                            satisfactory condition to undergo the procedure.                           After obtaining informed consent, the endoscope was  passed under direct vision. Throughout the                            procedure, the patient's blood pressure, pulse, and                            oxygen saturations were monitored continuously. The                            GIF [id number] #[id number] was introduced through the                            mouth, and advanced to the second part of duodenum.                            The upper GI  endoscopy was accomplished without                            difficulty. The patient tolerated the procedure                            well. Scope In: Scope Out: Findings:                 Biopsies were taken with a cold forceps in the                            entire esophagus for histology.                           A guidewire was placed and the scope was withdrawn.                            Dilation was performed in the esophagus with a                            Savary dilator with mild resistance at 15 mm.                           A non-obstructing Schatzki ring was found at the                            gastroesophageal junction. This was disrupted with                            forceps.                           A small hiatal hernia was present.                           Localized inflammation characterized by congestion                            (edema) and erythema was found in the gastric body  and in the gastric antrum. Biopsies were taken with                            a cold forceps for histology.                           Localized inflammation characterized by congestion                            (edema) and erythema was found in the duodenal                            bulb. Biopsies were taken with a cold forceps for                            histology. Complications:            No immediate complications. Estimated Blood Loss:     Estimated blood loss was minimal. Impression:               - Non-obstructing Schatzki ring.                           - Small hiatal hernia.                           - Gastritis. Biopsied.                           - Duodenitis. Biopsied.                           - Biopsies were taken with a cold forceps for                            histology in the entire esophagus.                           - Dilation performed in the esophagus. Recommendation:           - Discharge patient to home (with escort).                            - Return to GI clinic with Dr. Silverio Decamp in 2 months                            for follow up.                           - Use a proton pump inhibitor PO BID for 8 weeks.                           - Await pathology results.                           - The findings and recommendations were discussed  with the patient. Sonny Masters "Anna Valenzuela,  01/10/2021 12:03:53 PM
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
## 2021-01-10 NOTE — Patient Instructions (Signed)
Thank you for letting us take care of your healthcare needs today. Please see handouts Gastritis and POst Dilation diet. Continue Prilosec for 8 weeks. Follow up with Dr. Silverio Decamp in 8 weeks.    YOU HAD AN ENDOSCOPIC PROCEDURE TODAY AT [facility]:   Refer to the procedure report that was given to you for any specific questions about what was found during the examination.  If the procedure report does not answer your questions, please call your gastroenterologist to clarify.  If you requested that your care partner not be given the details of your procedure findings, then the procedure report has been included in a sealed envelope for you to review at your convenience later.  YOU SHOULD EXPECT: Some feelings of bloating in the abdomen. Passage of more gas than usual.  Walking can help get rid of the air that was put into your GI tract during the procedure and reduce the bloating. If you had a lower endoscopy (such as a colonoscopy or flexible sigmoidoscopy) you may notice spotting of blood in your stool or on the toilet paper. If you underwent a bowel prep for your procedure, you may not have a normal bowel movement for a few days.  Please Note:  You might notice some irritation and congestion in your nose or some drainage.  This is from the oxygen used during your procedure.  There is no need for concern and it should clear up in a day or so.  SYMPTOMS TO REPORT IMMEDIATELY:   Following upper endoscopy (EGD)  Vomiting of blood or coffee ground material  New chest pain or pain under the shoulder blades  Painful or persistently difficult swallowing  New shortness of breath  Fever of 100F or higher  Black, tarry-looking stools  For urgent or emergent issues, a gastroenterologist can be reached at any hour by calling [phone]. Do not use MyChart messaging for urgent concerns.    DIET:  We do recommend a small meal at first, but then you may proceed to your regular  diet.  Drink plenty of fluids but you should avoid alcoholic beverages for 24 hours.  ACTIVITY:  You should plan to take it easy for the rest of today and you should NOT DRIVE or use heavy machinery until tomorrow (because of the sedation medicines used during the test).    FOLLOW UP: Our staff will call the number listed on your records 48-72 hours following your procedure to check on you and address any questions or concerns that you may have regarding the information given to you following your procedure. If we do not reach you, we will leave a message.  We will attempt to reach you two times.  During this call, we will ask if you have developed any symptoms of COVID 19. If you develop any symptoms (ie: fever, flu-like symptoms, shortness of breath, cough etc.) before then, please call [phone].  If you test positive for Covid 19 in the 2 weeks post procedure, please call and report this information to [place].    If any biopsies were taken you will be contacted by phone or by letter within the next 1-3 weeks.  Please call us at [phone] if you have not heard about the biopsies in 3 weeks.    SIGNATURES/CONFIDENTIALITY: You and/or your care partner have signed paperwork which will be entered into your electronic medical record.  These signatures attest to the fact that that the information above on your After Visit Summary  has been reviewed and is understood.  Full responsibility of the confidentiality of this discharge information lies with you and/or your care-partner.

## 2021-01-10 NOTE — Progress Notes (Signed)
Pt in recovery with monitors in place, VSS. Report given to receiving RN. Bite guard was placed with pt awake to ensure comfort. No dental or soft tissue damage noted. 

## 2021-01-11 ENCOUNTER — Telehealth: Payer: Self-pay

## 2021-01-11 NOTE — Telephone Encounter (Signed)
SECOND ATTEMPT:  Called pt and made her aware of appt date/time as documented below. Verbalized acceptance and understanding.

## 2021-01-11 NOTE — Telephone Encounter (Signed)
Per Dr.'s Lorenso Courier and [facility] request, pt has been scheduled for 2 mo f/u appt with Dr. Silverio Decamp on 03/20/20 @ 1040am. Called pt to make her aware of this appt date/time.

## 2021-01-12 ENCOUNTER — Telehealth: Payer: Self-pay | Admitting: *Deleted

## 2021-01-12 ENCOUNTER — Encounter: Payer: Self-pay | Admitting: Internal Medicine

## 2021-01-12 NOTE — Telephone Encounter (Signed)
°  Follow up Call-  Call back number 01/10/2021 09/06/2019  Post procedure Call Back phone  # [phone]  Permission to leave phone message Yes Yes  Some recent data might be hidden     Patient questions:  Do you have a fever, pain , or abdominal swelling? No. Pain Score  0 *  Have you tolerated food without any problems? Yes.    Have you been able to return to your normal activities? Yes.    Do you have any questions about your discharge instructions: Diet   No. Medications  No. Follow up visit  No.  Do you have questions or concerns about your Care? No.  Actions: * If pain score is 4 or above: No action needed, pain <4.  Have you developed a fever since your procedure? no  2.   Have you had an respiratory symptoms (SOB or cough) since your procedure? no  3.   Have you tested positive for COVID 19 since your procedure no  4.   Have you had any family members/close contacts diagnosed with the COVID 19 since your procedure?  no   If yes to any of these questions please route to Joylene John, RN and Joella Prince, RN

## 2021-01-12 NOTE — Telephone Encounter (Signed)
°  Follow up Call-  Call back number 01/10/2021 09/06/2019  Post procedure Call Back phone  # [phone]  Permission to leave phone message Yes Yes  Some recent data might be hidden   [facility]

## 2021-01-18 ENCOUNTER — Other Ambulatory Visit: Payer: Self-pay

## 2021-01-18 ENCOUNTER — Encounter: Payer: Self-pay | Admitting: Gastroenterology

## 2021-01-18 ENCOUNTER — Ambulatory Visit (HOSPITAL_COMMUNITY)
Admission: RE | Admit: 2021-01-18 | Discharge: 2021-01-18 | Disposition: A | Discharge status: Home / Self Care | Payer: 59 | Source: Ambulatory Visit | Attending: Gastroenterology | Admitting: Gastroenterology

## 2021-01-18 DIAGNOSIS — R142 Eructation: Secondary | ICD-10-CM | POA: Diagnosis present

## 2021-01-18 DIAGNOSIS — R1013 Epigastric pain: Secondary | ICD-10-CM | POA: Diagnosis present

## 2021-01-18 DIAGNOSIS — R1310 Dysphagia, unspecified: Secondary | ICD-10-CM | POA: Insufficient documentation

## 2021-01-18 DIAGNOSIS — K219 Gastro-esophageal reflux disease without esophagitis: Secondary | ICD-10-CM | POA: Insufficient documentation

## 2021-03-20 ENCOUNTER — Ambulatory Visit: Payer: Managed Care, Other (non HMO) | Admitting: Gastroenterology

## 2021-03-20 ENCOUNTER — Ambulatory Visit: Payer: 59 | Admitting: Gastroenterology

## 2021-03-20 ENCOUNTER — Encounter: Payer: Self-pay | Admitting: Gastroenterology

## 2021-03-20 VITALS — BP 86/58 | HR 75 | Ht 60.25 in | Wt 149.0 lb

## 2021-03-20 DIAGNOSIS — K222 Esophageal obstruction: Secondary | ICD-10-CM | POA: Diagnosis not present

## 2021-03-20 DIAGNOSIS — K219 Gastro-esophageal reflux disease without esophagitis: Secondary | ICD-10-CM

## 2021-03-20 MED ORDER — OMEPRAZOLE 20 MG PO CPDR: 20.0000 mg | DELAYED_RELEASE_CAPSULE | Freq: Every day | ORAL | 3 refills | Status: DC

## 2021-03-20 NOTE — Patient Instructions (Addendum)
We will send Omeprazole to your pharmacy  Follow up in 6 months  Gastroesophageal Reflux Disease, Adult Gastroesophageal reflux (GER) happens when acid from the stomach flows up into the tube that connects the mouth and the stomach (esophagus). Normally, food travels down the esophagus and stays in the stomach to be digested. However, when a person has GER, food and stomach acid sometimes move back up into the esophagus. If this becomes a more serious problem, the person may be diagnosed with a disease called gastroesophageal reflux disease (GERD). GERD occurs when the reflux: Happens often. Causes frequent or severe symptoms. Causes problems such as damage to the esophagus. When stomach acid comes in contact with the esophagus, the acid may cause inflammation in the esophagus. Over time, GERD may create small holes (ulcers) in the lining of the esophagus. What are the causes? This condition is caused by a problem with the muscle between the esophagus and the stomach (lower esophageal sphincter, or LES). Normally, the LES muscle closes after food passes through the esophagus to the stomach. When the LES is weakened or abnormal, it does not close properly, and that allows food and stomach acid to go back up into the esophagus. The LES can be weakened by certain dietary substances, medicines, and medical conditions, including: Tobacco use. Pregnancy. Having a hiatal hernia. Alcohol use. Certain foods and beverages, such as coffee, chocolate, onions, and peppermint. What increases the risk? You are more likely to develop this condition if you: Have an increased body weight. Have a connective tissue disorder. Take NSAIDs, such as ibuprofen. What are the signs or symptoms? Symptoms of this condition include: Heartburn. Difficult or painful swallowing and the feeling of having a lump in the throat. A bitter taste in the mouth. Bad breath and having a large amount of saliva. Having an upset or  bloated stomach and belching. Chest pain. Different conditions can cause chest pain. Make sure you see your health care provider if you experience chest pain. Shortness of breath or wheezing. Ongoing (chronic) cough or a nighttime cough. Wearing away of tooth enamel. Weight loss. How is this diagnosed? This condition may be diagnosed based on a medical history and a physical exam. To determine if you have mild or severe GERD, your health care provider may also monitor how you respond to treatment. You may also have tests, including: A test to examine your stomach and esophagus with a small camera (endoscopy). A test that measures the acidity level in your esophagus. A test that measures how much pressure is on your esophagus. A barium swallow or modified barium swallow test to show the shape, size, and functioning of your esophagus. How is this treated? Treatment for this condition may vary depending on how severe your symptoms are. Your health care provider may recommend: Changes to your diet. Medicine. Surgery. The goal of treatment is to help relieve your symptoms and to prevent complications. Follow these instructions at home: Eating and drinking  Follow a diet as recommended by your health care provider. This may involve avoiding foods and drinks such as: Coffee and tea, with or without caffeine. Drinks that contain alcohol. Energy drinks and sports drinks. Carbonated drinks or sodas. Chocolate and cocoa. Peppermint and mint flavorings. Garlic and onions. Horseradish. Spicy and acidic foods, including peppers, chili powder, curry powder, vinegar, hot sauces, and barbecue sauce. Citrus fruit juices and citrus fruits, such as oranges, lemons, and limes. Tomato-based foods, such as red sauce, chili, salsa, and pizza with red sauce.  Fried and fatty foods, such as donuts, french fries, potato chips, and high-fat dressings. High-fat meats, such as hot dogs and fatty cuts of red and  white meats, such as rib eye steak, sausage, ham, and bacon. High-fat dairy items, such as whole milk, butter, and cream cheese. Eat small, frequent meals instead of large meals. Avoid drinking large amounts of liquid with your meals. Avoid eating meals during the 2-3 hours before bedtime. Avoid lying down right after you eat. Do not exercise right after you eat. Lifestyle  Do not use any products that contain nicotine or tobacco. These products include cigarettes, chewing tobacco, and vaping devices, such as e-cigarettes. If you need help quitting, ask your health care provider. Try to reduce your stress by using methods such as yoga or meditation. If you need help reducing stress, ask your health care provider. If you are overweight, reduce your weight to an amount that is healthy for you. Ask your health care provider for guidance about a safe weight loss goal. General instructions Pay attention to any changes in your symptoms. Take over-the-counter and prescription medicines only as told by your health care provider. Do not take aspirin, ibuprofen, or other NSAIDs unless your health care provider told you to take these medicines. Wear loose-fitting clothing. Do not wear anything tight around your waist that causes pressure on your abdomen. Raise (elevate) the head of your bed about 6 inches (15 cm). You can use a wedge to do this. Avoid bending over if this makes your symptoms worse. Keep all follow-up visits. This is important. Contact a health care provider if: You have: New symptoms. Unexplained weight loss. Difficulty swallowing or it hurts to swallow. Wheezing or a persistent cough. A hoarse voice. Your symptoms do not improve with treatment. Get help right away if: You have sudden pain in your arms, neck, jaw, teeth, or back. You suddenly feel sweaty, dizzy, or light-headed. You have chest pain or shortness of breath. You vomit and the vomit is green, yellow, or black, or it  looks like blood or coffee grounds. You faint. You have stool that is red, bloody, or black. You cannot swallow, drink, or eat. These symptoms may represent a serious problem that is an emergency. Do not wait to see if the symptoms will go away. Get medical help right away. Call your local emergency services (911 in the [place] Do not drive yourself to the hospital. Summary Gastroesophageal reflux happens when acid from the stomach flows up into the esophagus. GERD is a disease in which the reflux happens often, causes frequent or severe symptoms, or causes problems such as damage to the esophagus. Treatment for this condition may vary depending on how severe your symptoms are. Your health care provider may recommend diet and lifestyle changes, medicine, or surgery. Contact a health care provider if you have new or worsening symptoms. Take over-the-counter and prescription medicines only as told by your health care provider. Do not take aspirin, ibuprofen, or other NSAIDs unless your health care provider told you to do so. Keep all follow-up visits as told by your health care provider. This is important. This information is not intended to replace advice given to you by your health care provider. Make sure you discuss any questions you have with your health care provider. Document Revised: 07/19/2019 Document Reviewed: 07/19/2019 Elsevier Patient Education  [place].  If you are age 48 or older, your body mass index should be between 23-30. Your Body mass index is  28.86 kg/m. If this is out of the aforementioned range listed, please consider follow up with your Primary Care Provider.  If you are age 61 or younger, your body mass index should be between 19-25. Your Body mass index is 28.86 kg/m. If this is out of the aformentioned range listed, please consider follow up with your Primary Care Provider.   ________________________________________________________  The [facility] providers  would like to encourage you to use [facility] to communicate with providers for non-urgent requests or questions.  Due to long hold times on the telephone, sending your provider a message by [facility] may be a faster and more efficient way to get a response.  Please allow 48 business hours for a response.  Please remember that this is for non-urgent requests.  _______________________________________________________   Thank you for choosing [facility]  Kavitha Nandigam,MD
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
## 2021-04-02 ENCOUNTER — Encounter: Payer: Self-pay | Admitting: Gastroenterology

## 2021-06-01 ENCOUNTER — Telehealth: Payer: Self-pay | Admitting: Gastroenterology

## 2021-06-01 MED ORDER — OMEPRAZOLE 20 MG PO CPDR: 20.0000 mg | DELAYED_RELEASE_CAPSULE | Freq: Two times a day (BID) | ORAL | 2 refills | Status: DC

## 2021-06-01 NOTE — Telephone Encounter (Signed)
I called and left Anna Valenzuela a detailed message that I have re-sent the omeprazole '20mg'$  for one twice a day as the last office visit stated. Sent to local pharmacy-[facility]. ?

## 2021-06-01 NOTE — Telephone Encounter (Signed)
Patient called stating when she went to the pharmacy to pick up her Prilosec, it was written for once a day; but she says Dr. Silverio Decamp told her at her recent OV that she wanted her to take it twice a day.  Please call patient and clarify and, if necessary, call the pharmacy to correct.  Thank you. ?

## 2021-09-07 ENCOUNTER — Telehealth: Payer: Self-pay | Admitting: Gastroenterology

## 2021-09-07 MED ORDER — OMEPRAZOLE 20 MG PO CPDR: 20.0000 mg | DELAYED_RELEASE_CAPSULE | Freq: Two times a day (BID) | ORAL | 2 refills | Status: DC

## 2021-09-07 NOTE — Telephone Encounter (Signed)
[facility] pharmacy called regarding patient Omeprazole 20 mg. Requesting a call back on [phone].

## 2021-09-07 NOTE — Telephone Encounter (Signed)
Omeprazole sent to pharmacy

## 2021-09-19 DIAGNOSIS — Z6827 Body mass index (BMI) 27.0-27.9, adult: Secondary | ICD-10-CM | POA: Diagnosis not present

## 2021-09-19 DIAGNOSIS — Z8639 Personal history of other endocrine, nutritional and metabolic disease: Secondary | ICD-10-CM | POA: Diagnosis not present

## 2021-09-19 DIAGNOSIS — Z9181 History of falling: Secondary | ICD-10-CM | POA: Diagnosis not present

## 2021-09-19 DIAGNOSIS — Z01419 Encounter for gynecological examination (general) (routine) without abnormal findings: Secondary | ICD-10-CM | POA: Diagnosis not present

## 2021-09-19 DIAGNOSIS — E7800 Pure hypercholesterolemia, unspecified: Secondary | ICD-10-CM | POA: Diagnosis not present

## 2021-09-19 DIAGNOSIS — M810 Age-related osteoporosis without current pathological fracture: Secondary | ICD-10-CM | POA: Diagnosis not present

## 2021-09-20 ENCOUNTER — Other Ambulatory Visit: Payer: Self-pay | Admitting: Family Medicine

## 2021-09-20 DIAGNOSIS — N63 Unspecified lump in unspecified breast: Secondary | ICD-10-CM

## 2021-10-08 ENCOUNTER — Ambulatory Visit
Admission: RE | Admit: 2021-10-08 | Discharge: 2021-10-08 | Disposition: A | Discharge status: Home / Self Care | Payer: Medicare HMO | Source: Ambulatory Visit | Attending: Family Medicine | Admitting: Family Medicine

## 2021-10-08 ENCOUNTER — Other Ambulatory Visit: Payer: Self-pay | Admitting: Family Medicine

## 2021-10-08 ENCOUNTER — Ambulatory Visit
Admission: RE | Admit: 2021-10-08 | Discharge: 2021-10-08 | Disposition: A | Discharge status: Home / Self Care | Payer: Managed Care, Other (non HMO) | Source: Ambulatory Visit | Attending: Family Medicine | Admitting: Family Medicine

## 2021-10-08 DIAGNOSIS — N63 Unspecified lump in unspecified breast: Secondary | ICD-10-CM

## 2021-10-08 DIAGNOSIS — N6489 Other specified disorders of breast: Secondary | ICD-10-CM | POA: Diagnosis not present

## 2021-10-08 DIAGNOSIS — R928 Other abnormal and inconclusive findings on diagnostic imaging of breast: Secondary | ICD-10-CM | POA: Diagnosis not present

## 2021-10-11 DIAGNOSIS — S92354A Nondisplaced fracture of fifth metatarsal bone, right foot, initial encounter for closed fracture: Secondary | ICD-10-CM | POA: Diagnosis not present

## 2021-10-11 DIAGNOSIS — M79671 Pain in right foot: Secondary | ICD-10-CM | POA: Diagnosis not present

## 2021-10-11 DIAGNOSIS — S92351A Displaced fracture of fifth metatarsal bone, right foot, initial encounter for closed fracture: Secondary | ICD-10-CM | POA: Diagnosis not present

## 2021-10-29 ENCOUNTER — Ambulatory Visit: Payer: Medicare HMO | Admitting: Gastroenterology

## 2021-10-29 ENCOUNTER — Encounter: Payer: Self-pay | Admitting: Gastroenterology

## 2021-10-29 VITALS — BP 108/62 | HR 80 | Ht 61.5 in | Wt 146.5 lb

## 2021-10-29 DIAGNOSIS — R1310 Dysphagia, unspecified: Secondary | ICD-10-CM

## 2021-10-29 DIAGNOSIS — R09A2 Foreign body sensation, throat: Secondary | ICD-10-CM | POA: Diagnosis not present

## 2021-10-29 DIAGNOSIS — K219 Gastro-esophageal reflux disease without esophagitis: Secondary | ICD-10-CM

## 2021-10-29 MED ORDER — OMEPRAZOLE 40 MG PO CPDR: 40.0000 mg | DELAYED_RELEASE_CAPSULE | Freq: Every day | ORAL | 3 refills | Status: DC

## 2021-10-29 NOTE — Patient Instructions (Signed)
You have been scheduled for an endoscopy and colonoscopy. Please follow the written instructions given to you at your visit today. Please pick up your prep supplies at the pharmacy within the next 1-3 days. If you use inhalers (even only as needed), please bring them with you on the day of your procedure.  We have sent the following medications to your pharmacy for you to pick up at your convenience: Omeprazole 40 mg daily before breakfast x 30 days  _______________________________________________________  If you are age 24 or older, your body mass index should be between 23-30. Your Body mass index is 27.23 kg/m. If this is out of the aforementioned range listed, please consider follow up with your Primary Care Provider.  If you are age 66 or younger, your body mass index should be between 19-25. Your Body mass index is 27.23 kg/m. If this is out of the aformentioned range listed, please consider follow up with your Primary Care Provider.   ________________________________________________________  The [place] [facility] providers would like to encourage you to use [facility] to communicate with providers for non-urgent requests or questions.  Due to long hold times on the telephone, sending your provider a message by [facility] may be a faster and more efficient way to get a response.  Please allow 48 business hours for a response.  Please remember that this is for non-urgent requests.  _______________________________________________________ Due to recent changes in healthcare laws, you may see the results of your imaging and laboratory studies on MyChart before your provider has had a chance to review them.  We understand that in some cases there may be results that are confusing or concerning to you. Not all laboratory results come back in the same time frame and the provider may be waiting for multiple results in order to interpret others.  Please give [place] 48 hours in order for your provider to thoroughly  review all the results before contacting the office for clarification of your results.
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
## 2021-11-07 DIAGNOSIS — S92351D Displaced fracture of fifth metatarsal bone, right foot, subsequent encounter for fracture with routine healing: Secondary | ICD-10-CM | POA: Diagnosis not present

## 2021-11-12 ENCOUNTER — Encounter: Payer: Medicare HMO | Admitting: Gastroenterology

## 2021-11-24 ENCOUNTER — Encounter: Payer: Self-pay | Admitting: Certified Registered Nurse Anesthetist

## 2021-11-28 ENCOUNTER — Encounter: Payer: Self-pay | Admitting: Gastroenterology

## 2021-11-28 ENCOUNTER — Ambulatory Visit (AMBULATORY_SURGERY_CENTER): Payer: Medicare HMO | Admitting: Gastroenterology

## 2021-11-28 VITALS — BP 107/61 | HR 93 | Temp 97.1°F | Resp 15 | Ht 61.5 in | Wt 146.0 lb

## 2021-11-28 DIAGNOSIS — K2970 Gastritis, unspecified, without bleeding: Secondary | ICD-10-CM | POA: Diagnosis not present

## 2021-11-28 DIAGNOSIS — K449 Diaphragmatic hernia without obstruction or gangrene: Secondary | ICD-10-CM

## 2021-11-28 DIAGNOSIS — R1310 Dysphagia, unspecified: Secondary | ICD-10-CM

## 2021-11-28 DIAGNOSIS — J449 Chronic obstructive pulmonary disease, unspecified: Secondary | ICD-10-CM | POA: Diagnosis not present

## 2021-11-28 DIAGNOSIS — Z720 Tobacco use: Secondary | ICD-10-CM | POA: Diagnosis not present

## 2021-11-28 DIAGNOSIS — K319 Disease of stomach and duodenum, unspecified: Secondary | ICD-10-CM | POA: Diagnosis not present

## 2021-11-28 MED ORDER — SODIUM CHLORIDE 0.9 % IV SOLN: 500.0000 mL | Freq: Once | INTRAVENOUS | Status: DC

## 2021-11-28 NOTE — Progress Notes (Unsigned)
1520 Ephedrine 10 mg given IV due to low BP, MD updated.

## 2021-11-28 NOTE — Patient Instructions (Addendum)
- Patient has a contact number available for emergencies. The signs and symptoms of potential delayed complications were discussed with the patient. Return to normal activities tomorrow. Written discharge instructions were provided to the patient. activities tomorrow. Written discharge instructions were provided to the patient. - Resume previous diet. - Continue present medications. - Await pathology results. - Return to GI office in 3 months. Call office to schedule.  YOU HAD AN ENDOSCOPIC PROCEDURE TODAY AT [facility]:   Refer to the procedure report that was given to you for any specific questions about what was found during the examination.  If the procedure report does not answer your questions, please call your gastroenterologist to clarify.  If you requested that your care partner not be given the details of your procedure findings, then the procedure report has been included in a sealed envelope for you to review at your convenience later.  YOU SHOULD EXPECT: Some feelings of bloating in the abdomen. Passage of more gas than usual.  Walking can help get rid of the air that was put into your GI tract during the procedure and reduce the bloating. If you had a lower endoscopy (such as a colonoscopy or flexible sigmoidoscopy) you may notice spotting of blood in your stool or on the toilet paper. If you underwent a bowel prep for your procedure, you may not have a normal bowel movement for a few days.  Please Note:  You might notice some irritation and congestion in your nose or some drainage.  This is from the oxygen used during your procedure.  There is no need for concern and it should clear up in a day or so.  SYMPTOMS TO REPORT IMMEDIATELY:  Following upper endoscopy (EGD)  Vomiting of blood or coffee ground material  New chest pain or pain under the shoulder blades  Painful or persistently difficult swallowing  New shortness of breath  Fever of 100F or  higher  Black, tarry-looking stools  For urgent or emergent issues, a gastroenterologist can be reached at any hour by calling [phone]. Do not use MyChart messaging for urgent concerns.    DIET:  We do recommend a small meal at first, but then you may proceed to your regular diet.  Drink plenty of fluids but you should avoid alcoholic beverages for 24 hours.  ACTIVITY:  You should plan to take it easy for the rest of today and you should NOT DRIVE or use heavy machinery until tomorrow (because of the sedation medicines used during the test).    FOLLOW UP: Our staff will call the number listed on your records the next business day following your procedure.  We will call around 7:15- 8:00 am to check on you and address any questions or concerns that you may have regarding the information given to you following your procedure. If we do not reach you, we will leave a message.     If any biopsies were taken you will be contacted by phone or by letter within the next 1-3 weeks.  Please call us at [phone] if you have not heard about the biopsies in 3 weeks.    SIGNATURES/CONFIDENTIALITY: You and/or your care partner have signed paperwork which will be entered into your electronic medical record.  These signatures attest to the fact that that the information above on your After Visit Summary has been reviewed and is understood.  Full responsibility of the confidentiality of this discharge information lies with you and/or your care-partner.

## 2021-11-28 NOTE — Progress Notes (Unsigned)
Please refer to office visit note 10/29/21. No additional changes in H&P Patient is appropriate for planned procedure(s) and anesthesia in an ambulatory setting  K. Denzil Magnuson , MD [phone]

## 2021-11-28 NOTE — Progress Notes (Unsigned)
Report given to PACU, vss 

## 2021-11-28 NOTE — Progress Notes (Unsigned)
1505 Robinul 0.1 mg IV given due large amount of secretions upon assessment.  MD made aware, vss

## 2021-11-28 NOTE — Op Note (Signed)
[facility] Patient Name: Anna Valenzuela Procedure Date: 11/28/2021 2:38 PM MRN: [id number] Endoscopist: Mauri Pole , MD, [id number] Age: 66 Referring MD:  Date of Birth: 11/28/1955 Gender: Female Account #: [id number] Procedure:                Upper GI endoscopy Indications:              Dysphagia, Epigastric abdominal pain Medicines:                Monitored Anesthesia Care Procedure:                Pre-Anesthesia Assessment:                           - Prior to the procedure, a History and Physical                            was performed, and patient medications and                            allergies were reviewed. The patient's tolerance of                            previous anesthesia was also reviewed. The risks                            and benefits of the procedure and the sedation                            options and risks were discussed with the patient.                            All questions were answered, and informed consent                            was obtained. Prior Anticoagulants: The patient has                            taken no anticoagulant or antiplatelet agents. ASA                            Grade Assessment: III - A patient with severe                            systemic disease. After reviewing the risks and                            benefits, the patient was deemed in satisfactory                            condition to undergo the procedure.                           After obtaining informed consent, the endoscope was  passed under direct vision. Throughout the                            procedure, the patient's blood pressure, pulse, and                            oxygen saturations were monitored continuously. The                            GIF [id number] was introduced through the                            mouth, and advanced to the second part of duodenum.                            The upper  GI endoscopy was accomplished without                            difficulty. The patient tolerated the procedure                            well. Scope In: Scope Out: Findings:                 The Z-line was regular and was found 40 cm from the                            incisors.                           No endoscopic abnormality was evident in the                            esophagus to explain the patient's complaint of                            dysphagia. It was decided, however, to proceed with                            dilation of the lower third of the esophagus. A TTS                            dilator was passed through the scope. Dilation with                            an 18-19-20 mm balloon dilator was performed to 20                            mm. The dilation site was examined following                            endoscope reinsertion and showed no change.  Patchy mild inflammation characterized by                            congestion (edema) and erosions was found in the                            prepyloric region of the stomach. Biopsies were                            taken with a cold forceps for histology. Biopsies                            were taken with a cold forceps for Helicobacter                            pylori testing.                           The cardia and gastric fundus were normal on                            retroflexion.                           A 2 cm hiatal hernia was present.                           The examined duodenum was normal. Complications:            No immediate complications. Estimated Blood Loss:     Estimated blood loss was minimal. Impression:               - Z-line regular, 40 cm from the incisors.                           - No endoscopic esophageal abnormality to explain                            patient's dysphagia. Esophagus dilated. Dilated.                           - Gastritis. Biopsied.                            - 2 cm hiatal hernia.                           - Normal examined duodenum. Recommendation:           - Patient has a contact number available for                            emergencies. The signs and symptoms of potential                            delayed complications were discussed with the  patient. Return to normal activities tomorrow.                            Written discharge instructions were provided to the                            patient.                           - Resume previous diet.                           - Continue present medications.                           - Await pathology results.                           - Return to GI office in 3 months. Mauri Pole, MD 11/28/2021 3:27:50 PM This report has been signed electronically.

## 2021-11-28 NOTE — Progress Notes (Unsigned)
VS completed by CW.   Pt's states no medical or surgical changes since previsit or office visit.  

## 2021-11-29 ENCOUNTER — Telehealth: Payer: Self-pay

## 2021-11-29 NOTE — Telephone Encounter (Signed)
  Follow up Call-     11/28/2021    2:16 PM 01/10/2021   11:00 AM 09/06/2019    1:22 PM  Call back number  Post procedure Call Back phone  # [phone]  Permission to leave phone message Yes Yes Yes     Left message

## 2021-12-03 DIAGNOSIS — S92351D Displaced fracture of fifth metatarsal bone, right foot, subsequent encounter for fracture with routine healing: Secondary | ICD-10-CM | POA: Diagnosis not present

## 2021-12-10 DIAGNOSIS — B9689 Other specified bacterial agents as the cause of diseases classified elsewhere: Secondary | ICD-10-CM | POA: Diagnosis not present

## 2021-12-10 DIAGNOSIS — Z6827 Body mass index (BMI) 27.0-27.9, adult: Secondary | ICD-10-CM | POA: Diagnosis not present

## 2021-12-10 DIAGNOSIS — J449 Chronic obstructive pulmonary disease, unspecified: Secondary | ICD-10-CM | POA: Diagnosis not present

## 2021-12-10 DIAGNOSIS — M5410 Radiculopathy, site unspecified: Secondary | ICD-10-CM | POA: Diagnosis not present

## 2021-12-10 DIAGNOSIS — N760 Acute vaginitis: Secondary | ICD-10-CM | POA: Diagnosis not present

## 2021-12-10 DIAGNOSIS — G4733 Obstructive sleep apnea (adult) (pediatric): Secondary | ICD-10-CM | POA: Diagnosis not present

## 2021-12-10 DIAGNOSIS — M810 Age-related osteoporosis without current pathological fracture: Secondary | ICD-10-CM | POA: Diagnosis not present

## 2021-12-17 DIAGNOSIS — H5203 Hypermetropia, bilateral: Secondary | ICD-10-CM | POA: Diagnosis not present

## 2021-12-17 DIAGNOSIS — H524 Presbyopia: Secondary | ICD-10-CM | POA: Diagnosis not present

## 2021-12-17 DIAGNOSIS — Z961 Presence of intraocular lens: Secondary | ICD-10-CM | POA: Diagnosis not present

## 2021-12-17 DIAGNOSIS — H52223 Regular astigmatism, bilateral: Secondary | ICD-10-CM | POA: Diagnosis not present

## 2021-12-18 ENCOUNTER — Encounter: Payer: Self-pay | Admitting: Cardiology

## 2021-12-18 ENCOUNTER — Ambulatory Visit (INDEPENDENT_AMBULATORY_CARE_PROVIDER_SITE_OTHER): Payer: Medicare HMO

## 2021-12-18 ENCOUNTER — Ambulatory Visit: Payer: Medicare HMO | Attending: Cardiology | Admitting: Cardiology

## 2021-12-18 VITALS — BP 102/78 | HR 69 | Ht 61.0 in | Wt 149.4 lb

## 2021-12-18 DIAGNOSIS — R002 Palpitations: Secondary | ICD-10-CM | POA: Diagnosis not present

## 2021-12-18 DIAGNOSIS — R42 Dizziness and giddiness: Secondary | ICD-10-CM

## 2021-12-18 DIAGNOSIS — E782 Mixed hyperlipidemia: Secondary | ICD-10-CM

## 2021-12-18 DIAGNOSIS — R5383 Other fatigue: Secondary | ICD-10-CM | POA: Diagnosis not present

## 2021-12-18 DIAGNOSIS — F17200 Nicotine dependence, unspecified, uncomplicated: Secondary | ICD-10-CM

## 2021-12-18 MED ORDER — ROSUVASTATIN CALCIUM 5 MG PO TABS: 5.0000 mg | ORAL_TABLET | Freq: Every day | ORAL | 3 refills | Status: DC

## 2021-12-18 NOTE — Progress Notes (Unsigned)
Enrolled for [employer] to mail a ZIO XT long term holter monitor to the patients address on file.  

## 2021-12-18 NOTE — Patient Instructions (Signed)
Medication Instructions:  Your physician has recommended you make the following change in your medication:  STOP: Atorvastatin  Start; Rosuvastatin '5mg'$  daily.  *If you need a refill on your cardiac medications before your next appointment, please call your pharmacy*   Lab Work: NONE If you have labs (blood work) drawn today and your tests are completely normal, you will receive your results only by: [facility] (if you have MyChart) OR A paper copy in the mail If you have any lab test that is abnormal or we need to change your treatment, we will call you to review the results.   Testing/Procedures: [place] [place]- Long Term Monitor Instructions  Your physician has requested you wear a ZIO patch monitor for 14 days.  This is a single patch monitor. [employer] supplies one patch monitor per enrollment. Additional stickers are not available. Please do not apply patch if you will be having a Nuclear Stress Test,  Echocardiogram, Cardiac CT, MRI, or Chest Xray during the period you would be wearing the  monitor. The patch cannot be worn during these tests. You cannot remove and re-apply the  ZIO XT patch monitor.  Your ZIO patch monitor will be mailed 3 day USPS to your address on file. It may take 3-5 days  to receive your monitor after you have been enrolled.  Once you have received your monitor, please review the enclosed instructions. Your monitor  has already been registered assigning a specific monitor serial # to you.  Billing and Patient Assistance Program Information  We have supplied [employer] with any of your insurance information on file for billing purposes. [employer] offers a sliding scale Patient Assistance Program for patients that do not have  insurance, or whose insurance does not completely cover the cost of the ZIO monitor.  You must apply for the Patient Assistance Program to qualify for this discounted rate.  To apply, please call [employer] at [phone], select option 4,  select option 2, ask to apply for  Patient Assistance Program. Theodore Demark will ask your household income, and how many people  are in your household. They will quote your out-of-pocket cost based on that information.  [employer] will also be able to set up a 84-month interest-free payment plan if needed.  Applying the monitor   Shave hair from upper left chest.  Hold abrader disc by orange tab. Rub abrader in 40 strokes over the upper left chest as  indicated in your monitor instructions.  Clean area with 4 enclosed alcohol pads. Let dry.  Apply patch as indicated in monitor instructions. Patch will be placed under collarbone on left  side of chest with arrow pointing upward.  Rub patch adhesive wings for 2 minutes. Remove white label marked "1". Remove the white  label marked "2". Rub patch adhesive wings for 2 additional minutes.  While looking in a mirror, press and release button in center of patch. A small green light will  flash 3-4 times. This will be your only indicator that the monitor has been turned on.  Do not shower for the first 24 hours. You may shower after the first 24 hours.  Press the button if you feel a symptom. You will hear a small click. Record Date, Time and  Symptom in the Patient Logbook.  When you are ready to remove the patch, follow instructions on the last 2 pages of Patient  Logbook. Stick patch monitor onto the last page of Patient Logbook.  Place Patient Logbook in the blue and  white box. Use locking tab on box and tape box closed  securely. The blue and white box has prepaid postage on it. Please place it in the mailbox as  soon as possible. Your physician should have your test results approximately 7 days after the  monitor has been mailed back to [facility].  Call [place] at [phone] if you have questions regarding  your ZIO XT patch monitor. Call them immediately if you see an orange light blinking on your  monitor.  If your  monitor falls off in less than 4 days, contact our Monitor department at [phone].  If your monitor becomes loose or falls off after 4 days call [employer] at [phone] for  suggestions on securing your monitor    Follow-Up: At [facility], you and your health needs are our priority.  As part of our continuing mission to provide you with exceptional heart care, we have created designated Provider Care Teams.  These Care Teams include your primary Cardiologist (physician) and Advanced Practice Providers (APPs -  Physician Assistants and Nurse Practitioners) who all work together to provide you with the care you need, when you need it.  We recommend signing up for the patient portal called "MyChart".  Sign up information is provided on this After Visit Summary.  MyChart is used to connect with patients for Virtual Visits (Telemedicine).  Patients are able to view lab/test results, encounter notes, upcoming appointments, etc.  Non-urgent messages can be sent to your provider as well.   To learn more about what you can do with MyChart, go to [email or web address].    Your next appointment:   6 month(s)  The format for your next appointment:   In Person  Provider:   Berniece Salines, DO
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
## 2021-12-19 DIAGNOSIS — M545 Low back pain, unspecified: Secondary | ICD-10-CM | POA: Diagnosis not present

## 2021-12-19 DIAGNOSIS — M9903 Segmental and somatic dysfunction of lumbar region: Secondary | ICD-10-CM | POA: Diagnosis not present

## 2021-12-19 DIAGNOSIS — M9901 Segmental and somatic dysfunction of cervical region: Secondary | ICD-10-CM | POA: Diagnosis not present

## 2021-12-19 DIAGNOSIS — M542 Cervicalgia: Secondary | ICD-10-CM | POA: Diagnosis not present

## 2021-12-19 DIAGNOSIS — M9902 Segmental and somatic dysfunction of thoracic region: Secondary | ICD-10-CM | POA: Diagnosis not present

## 2021-12-19 DIAGNOSIS — M62830 Muscle spasm of back: Secondary | ICD-10-CM | POA: Diagnosis not present

## 2021-12-19 DIAGNOSIS — M62838 Other muscle spasm: Secondary | ICD-10-CM | POA: Diagnosis not present

## 2021-12-19 DIAGNOSIS — M546 Pain in thoracic spine: Secondary | ICD-10-CM | POA: Diagnosis not present

## 2021-12-24 ENCOUNTER — Encounter: Payer: Self-pay | Admitting: Gastroenterology

## 2021-12-31 DIAGNOSIS — S92351D Displaced fracture of fifth metatarsal bone, right foot, subsequent encounter for fracture with routine healing: Secondary | ICD-10-CM | POA: Diagnosis not present

## 2022-01-05 ENCOUNTER — Encounter (HOSPITAL_BASED_OUTPATIENT_CLINIC_OR_DEPARTMENT_OTHER): Payer: Self-pay

## 2022-01-05 DIAGNOSIS — G4733 Obstructive sleep apnea (adult) (pediatric): Secondary | ICD-10-CM

## 2022-01-07 DIAGNOSIS — M546 Pain in thoracic spine: Secondary | ICD-10-CM | POA: Diagnosis not present

## 2022-01-07 DIAGNOSIS — M9901 Segmental and somatic dysfunction of cervical region: Secondary | ICD-10-CM | POA: Diagnosis not present

## 2022-01-07 DIAGNOSIS — M542 Cervicalgia: Secondary | ICD-10-CM | POA: Diagnosis not present

## 2022-01-07 DIAGNOSIS — M9902 Segmental and somatic dysfunction of thoracic region: Secondary | ICD-10-CM | POA: Diagnosis not present

## 2022-01-07 DIAGNOSIS — M62838 Other muscle spasm: Secondary | ICD-10-CM | POA: Diagnosis not present

## 2022-01-07 DIAGNOSIS — M9903 Segmental and somatic dysfunction of lumbar region: Secondary | ICD-10-CM | POA: Diagnosis not present

## 2022-01-07 DIAGNOSIS — M545 Low back pain, unspecified: Secondary | ICD-10-CM | POA: Diagnosis not present

## 2022-01-07 DIAGNOSIS — M62830 Muscle spasm of back: Secondary | ICD-10-CM | POA: Diagnosis not present

## 2022-01-22 DIAGNOSIS — R0981 Nasal congestion: Secondary | ICD-10-CM | POA: Diagnosis not present

## 2022-01-22 DIAGNOSIS — R509 Fever, unspecified: Secondary | ICD-10-CM | POA: Diagnosis not present

## 2022-01-22 DIAGNOSIS — R051 Acute cough: Secondary | ICD-10-CM | POA: Diagnosis not present

## 2022-01-22 DIAGNOSIS — J111 Influenza due to unidentified influenza virus with other respiratory manifestations: Secondary | ICD-10-CM | POA: Diagnosis not present

## 2022-02-14 ENCOUNTER — Encounter: Payer: Self-pay | Admitting: Gastroenterology

## 2022-02-14 DIAGNOSIS — R051 Acute cough: Secondary | ICD-10-CM | POA: Diagnosis not present

## 2022-02-14 DIAGNOSIS — R0981 Nasal congestion: Secondary | ICD-10-CM | POA: Diagnosis not present

## 2022-02-14 DIAGNOSIS — J029 Acute pharyngitis, unspecified: Secondary | ICD-10-CM | POA: Diagnosis not present

## 2022-02-14 DIAGNOSIS — R062 Wheezing: Secondary | ICD-10-CM | POA: Diagnosis not present

## 2022-02-14 DIAGNOSIS — J019 Acute sinusitis, unspecified: Secondary | ICD-10-CM | POA: Diagnosis not present

## 2022-02-26 DIAGNOSIS — R059 Cough, unspecified: Secondary | ICD-10-CM | POA: Diagnosis not present

## 2022-02-26 DIAGNOSIS — Z6827 Body mass index (BMI) 27.0-27.9, adult: Secondary | ICD-10-CM | POA: Diagnosis not present

## 2022-03-05 DIAGNOSIS — R002 Palpitations: Secondary | ICD-10-CM | POA: Diagnosis not present

## 2022-03-05 DIAGNOSIS — R42 Dizziness and giddiness: Secondary | ICD-10-CM

## 2022-04-02 ENCOUNTER — Ambulatory Visit (HOSPITAL_BASED_OUTPATIENT_CLINIC_OR_DEPARTMENT_OTHER): Payer: Medicare HMO | Attending: Family Medicine | Admitting: Internal Medicine

## 2022-04-02 VITALS — Ht 61.5 in | Wt 148.0 lb

## 2022-04-02 DIAGNOSIS — R0683 Snoring: Secondary | ICD-10-CM | POA: Insufficient documentation

## 2022-04-02 DIAGNOSIS — G4733 Obstructive sleep apnea (adult) (pediatric): Secondary | ICD-10-CM

## 2022-04-02 DIAGNOSIS — I493 Ventricular premature depolarization: Secondary | ICD-10-CM | POA: Diagnosis not present

## 2022-04-02 DIAGNOSIS — R0902 Hypoxemia: Secondary | ICD-10-CM | POA: Insufficient documentation

## 2022-04-02 DIAGNOSIS — G4761 Periodic limb movement disorder: Secondary | ICD-10-CM | POA: Insufficient documentation

## 2022-04-03 DIAGNOSIS — R002 Palpitations: Secondary | ICD-10-CM | POA: Diagnosis not present

## 2022-04-03 DIAGNOSIS — R42 Dizziness and giddiness: Secondary | ICD-10-CM | POA: Diagnosis not present

## 2022-04-06 DIAGNOSIS — G4733 Obstructive sleep apnea (adult) (pediatric): Secondary | ICD-10-CM | POA: Diagnosis not present

## 2022-04-06 NOTE — Procedures (Signed)
     Patient Name: Anna Valenzuela, Anna Valenzuela Date: 04/02/2022 Gender: Female D.O.B: [birth year] Age (years): 34 Referring Provider: Ronita Hipps Height (inches): 3 Interpreting Physician: Baird Lyons MD, ABSM Weight (lbs): 148 RPSGT: Jorge Ny BMI: 28 MRN: [id number] Neck Size: 12.50  CLINICAL INFORMATION Sleep Study Type: NPSG Indication for sleep study: Fatigue, Hypertension, OSA, Re-Evaluation Epworth Sleepiness Score: 15  SLEEP STUDY TECHNIQUE As per the AASM Manual for the Scoring of Sleep and Associated Events v2.3 (April 2016) with a hypopnea requiring 4% desaturations.  The channels recorded and monitored were frontal, central and occipital EEG, electrooculogram (EOG), submentalis EMG (chin), nasal and oral airflow, thoracic and abdominal wall motion, anterior tibialis EMG, snore microphone, electrocardiogram, and pulse oximetry.  MEDICATIONS Medications self-administered by patient taken the night of the study : MELATONIN, Rosuvastatin  SLEEP ARCHITECTURE The study was initiated at 10:44:11 PM and ended at 5:17:29 AM.  Sleep onset time was 9.0 minutes and the sleep efficiency was 62.2%. The total sleep time was 244.5 minutes.  Stage REM latency was 116.5 minutes.  The patient spent 1.6% of the night in stage N1 sleep, 82.0% in stage N2 sleep, 0.0% in stage N3 and 16.4% in REM.  Alpha intrusion was absent.  Supine sleep was 23.45%.  RESPIRATORY PARAMETERS The overall apnea/hypopnea index (AHI) was 4.9 per hour. There were 0 total apneas, including 0 obstructive, 0 central and 0 mixed apneas. There were 20 hypopneas and 6 RERAs.  The AHI during Stage REM sleep was 25.5 per hour.  AHI while supine was 1.0 per hour.  The mean oxygen saturation was 90.1%. The minimum SpO2 during sleep was 81.0%.  moderate snoring was noted during this study.  CARDIAC DATA The 2 lead EKG demonstrated sinus rhythm. The mean heart rate was 83.9 beats per minute. Other EKG  findings include: PVCs.  LEG MOVEMENT DATA The total PLMS were 0 with a resulting PLMS index of 0.0. Associated arousal with leg movement index was 9.3 .  IMPRESSIONS - Occasional apneas occurred during this study, within normal limits (AHI = 4.9/h). - Oxygen desaturation was noted during this study (Min O2 = 81.0%, Mean 90.2%). Time with O2 saturation 88% or less was 42.6 minutes. - The patient snored with moderate snoring volume. - EKG findings include PVCs. - Limb movement total 66 (16.2/ hr). Limb movement with sleep disturbance 38 (9.3/ hr).  DIAGNOSIS - Nocturnal Hypoxemia (G47.36) - Periodic limb movement sleep disturbance  RECOMMENDATIONS - Consider trial of Mirapex, Requip, or Sinemet for treatment of Periodic Leg Movements of Sleep. - Attention to complaints of neck pain and peripheral numbness affecting sleep. - Sleep hygiene should be reviewed to assess factors that may improve sleep quality. - Weight management and regular exercise should be initiated or continued if appropriate.  [Electronically signed] 04/06/2022 01:30 PM  Baird Lyons MD, [place], American Board of Sleep Medicine NPI: [id number]                        [place], [place] [facility]  ELECTRONICALLY SIGNED ON:  04/06/2022, 1:23 PM [place] PH: [phone]   FX: [phone] [place]

## 2022-04-22 DIAGNOSIS — Z6827 Body mass index (BMI) 27.0-27.9, adult: Secondary | ICD-10-CM | POA: Diagnosis not present

## 2022-04-22 DIAGNOSIS — S46912A Strain of unspecified muscle, fascia and tendon at shoulder and upper arm level, left arm, initial encounter: Secondary | ICD-10-CM | POA: Diagnosis not present

## 2022-04-25 ENCOUNTER — Telehealth: Payer: Self-pay | Admitting: Cardiology

## 2022-04-25 NOTE — Telephone Encounter (Signed)
Pt returning nurses call regarding results. Please advise 

## 2022-04-25 NOTE — Telephone Encounter (Signed)
Anna Salines, DO 04/24/2022  4:57 PM EDT     Please bring her in sooner to discuss discuss her monitor result.   Spoke with pt regarding sooner visit to discuss her monitor. New appointment made. Pt verbalizes understanding.

## 2022-05-08 ENCOUNTER — Ambulatory Visit: Payer: Medicare HMO | Attending: Cardiology | Admitting: Cardiology

## 2022-05-08 ENCOUNTER — Encounter: Payer: Self-pay | Admitting: Cardiology

## 2022-05-08 VITALS — BP 118/72 | HR 78 | Ht 61.5 in | Wt 143.6 lb

## 2022-05-08 DIAGNOSIS — G4733 Obstructive sleep apnea (adult) (pediatric): Secondary | ICD-10-CM | POA: Diagnosis not present

## 2022-05-08 DIAGNOSIS — F17200 Nicotine dependence, unspecified, uncomplicated: Secondary | ICD-10-CM

## 2022-05-08 DIAGNOSIS — I471 Supraventricular tachycardia, unspecified: Secondary | ICD-10-CM

## 2022-05-08 DIAGNOSIS — I4729 Other ventricular tachycardia: Secondary | ICD-10-CM

## 2022-05-08 MED ORDER — METOPROLOL SUCCINATE ER 25 MG PO TB24: 12.5000 mg | ORAL_TABLET | Freq: Every day | ORAL | 3 refills | Status: DC

## 2022-05-08 NOTE — Patient Instructions (Addendum)
Medication Instructions:  Your physician has recommended you make the following change in your medication:  START: Toprol-XL (metoprolol succinate) 12.5 mg once daily *If you need a refill on your cardiac medications before your next appointment, please call your pharmacy*   Lab Work: None   Testing/Procedures: None   Follow-Up: At [facility], you and your health needs are our priority.  As part of our continuing mission to provide you with exceptional heart care, we have created designated Provider Care Teams.  These Care Teams include your primary Cardiologist (physician) and Advanced Practice Providers (APPs -  Physician Assistants and Nurse Practitioners) who all work together to provide you with the care you need, when you need it.  Your next appointment:   9 month(s)  Provider:   Thomasene Ripple, DO

## 2022-05-09 ENCOUNTER — Other Ambulatory Visit (INDEPENDENT_AMBULATORY_CARE_PROVIDER_SITE_OTHER): Payer: Medicare HMO

## 2022-05-09 ENCOUNTER — Telehealth: Payer: Self-pay | Admitting: Cardiology

## 2022-05-09 DIAGNOSIS — I209 Angina pectoris, unspecified: Secondary | ICD-10-CM | POA: Diagnosis not present

## 2022-05-09 NOTE — Telephone Encounter (Signed)
Patient is calling and said that [facility] has updated there system and she said that they have to trace of her Nitroglycerin. Also mentioned that she want to get more information about her concerns about CPAP.

## 2022-05-09 NOTE — Telephone Encounter (Signed)
Returned call to patient- who states that she would like to know if she can get a refill on her nitroglycerin- per patient this needs to be sent to [facility], though this is not on patients medication list. Advised patient I would check with Dr. Servando Salina about new order for Nitro.   Patient also wants to know if she can get some information on CPAP- patients sleep studies in chart. Advised I would sent to sleep coordinator.   Patient verbalized understanding.
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
## 2022-05-13 ENCOUNTER — Other Ambulatory Visit: Payer: Self-pay

## 2022-05-13 MED ORDER — NITROGLYCERIN 0.4 MG SL SUBL: 0.4000 mg | SUBLINGUAL_TABLET | SUBLINGUAL | 4 refills | Status: AC | PRN

## 2022-05-13 NOTE — Telephone Encounter (Addendum)
Called pt, no answer. Left message to let her know the prescription was sent into the pharmacy.  Prescription for Nitroglycerin sent into pt's preferred pharmacy.

## 2022-05-13 NOTE — Progress Notes (Signed)
Prescription for Nitroglycerin sent in pt's preferred pharmacy.

## 2022-05-15 DIAGNOSIS — Z79899 Other long term (current) drug therapy: Secondary | ICD-10-CM | POA: Diagnosis not present

## 2022-05-15 DIAGNOSIS — Z1339 Encounter for screening examination for other mental health and behavioral disorders: Secondary | ICD-10-CM | POA: Diagnosis not present

## 2022-05-15 DIAGNOSIS — Z1331 Encounter for screening for depression: Secondary | ICD-10-CM | POA: Diagnosis not present

## 2022-05-15 DIAGNOSIS — Z6826 Body mass index (BMI) 26.0-26.9, adult: Secondary | ICD-10-CM | POA: Diagnosis not present

## 2022-05-15 DIAGNOSIS — M810 Age-related osteoporosis without current pathological fracture: Secondary | ICD-10-CM | POA: Diagnosis not present

## 2022-05-15 DIAGNOSIS — L819 Disorder of pigmentation, unspecified: Secondary | ICD-10-CM | POA: Diagnosis not present

## 2022-05-15 DIAGNOSIS — M5410 Radiculopathy, site unspecified: Secondary | ICD-10-CM | POA: Diagnosis not present

## 2022-05-15 DIAGNOSIS — E782 Mixed hyperlipidemia: Secondary | ICD-10-CM | POA: Diagnosis not present

## 2022-05-20 DIAGNOSIS — M79602 Pain in left arm: Secondary | ICD-10-CM | POA: Diagnosis not present

## 2022-05-20 DIAGNOSIS — S46912A Strain of unspecified muscle, fascia and tendon at shoulder and upper arm level, left arm, initial encounter: Secondary | ICD-10-CM | POA: Diagnosis not present

## 2022-05-20 DIAGNOSIS — M25612 Stiffness of left shoulder, not elsewhere classified: Secondary | ICD-10-CM | POA: Diagnosis not present

## 2022-06-18 ENCOUNTER — Ambulatory Visit: Payer: Medicare HMO | Admitting: Cardiology

## 2022-06-28 ENCOUNTER — Telehealth: Payer: Self-pay

## 2022-06-28 NOTE — Telephone Encounter (Signed)
   Pre-operative Risk Assessment    Patient Name: Anna Valenzuela [facility]  DOB: [birth year] MRN: [id number]     Request for Surgical Clearance    Procedure:  Dental Extraction - Amount of Teeth to be Pulled:  Surgical extraction of 2 teeth  Date of Surgery:  Clearance TBD                                 Surgeon:  Dr. Gery Pray Surgeon's Group or Practice Name:  [facility] Phone number:  [phone] Fax number:  [phone]   Type of Clearance Requested:   - Medical  - Pharmacy:  Hold Aspirin pt will need instructions on when/if to hold; SBE needed?   Type of Anesthesia:  Local    Additional requests/questions:    Signed, Zada Finders   06/28/2022, 10:17 AM

## 2022-07-02 NOTE — Telephone Encounter (Addendum)
    Primary Cardiologist: Thomasene Ripple, DO  Chart reviewed as part of pre-operative protocol coverage. Simple dental extractions are considered low risk procedures per guidelines and generally do not require any specific cardiac clearance. It is also generally accepted that for simple extractions and dental cleanings, there is no need to interrupt blood thinner therapy.   Since surgical extraction-if the bleeding risk is too high, can consider holding ASA x 5 day prior and restarting when medically safe to do so. Patient does not have a history of CAD noted.   SBE prophylaxis is not required for the patient.  I will route this recommendation to the requesting party via Epic fax function and remove from pre-op pool.  Please call with questions.  Sharlene Dory, PA-C 07/02/2022, 8:17 AM

## 2022-07-10 DIAGNOSIS — L821 Other seborrheic keratosis: Secondary | ICD-10-CM | POA: Diagnosis not present

## 2022-07-10 DIAGNOSIS — L739 Follicular disorder, unspecified: Secondary | ICD-10-CM | POA: Diagnosis not present

## 2022-07-10 DIAGNOSIS — C44319 Basal cell carcinoma of skin of other parts of face: Secondary | ICD-10-CM | POA: Diagnosis not present

## 2022-07-16 DIAGNOSIS — R32 Unspecified urinary incontinence: Secondary | ICD-10-CM | POA: Diagnosis not present

## 2022-07-16 DIAGNOSIS — M545 Low back pain, unspecified: Secondary | ICD-10-CM | POA: Diagnosis not present

## 2022-07-16 DIAGNOSIS — Z6826 Body mass index (BMI) 26.0-26.9, adult: Secondary | ICD-10-CM | POA: Diagnosis not present

## 2022-07-16 DIAGNOSIS — G4733 Obstructive sleep apnea (adult) (pediatric): Secondary | ICD-10-CM | POA: Diagnosis not present

## 2022-07-16 DIAGNOSIS — M810 Age-related osteoporosis without current pathological fracture: Secondary | ICD-10-CM | POA: Diagnosis not present

## 2022-07-18 DIAGNOSIS — M79602 Pain in left arm: Secondary | ICD-10-CM | POA: Diagnosis not present

## 2022-07-18 DIAGNOSIS — M25612 Stiffness of left shoulder, not elsewhere classified: Secondary | ICD-10-CM | POA: Diagnosis not present

## 2022-07-18 DIAGNOSIS — S46912A Strain of unspecified muscle, fascia and tendon at shoulder and upper arm level, left arm, initial encounter: Secondary | ICD-10-CM | POA: Diagnosis not present

## 2022-07-27 IMAGING — US US ABDOMEN COMPLETE
1 series · 15 of 25 positions shown · non-contrast
Comparison: None.

CLINICAL DATA: Epigastric pain

EXAM:
ABDOMEN ULTRASOUND COMPLETE

[Series 1: us abdomen complete mc & wl · 15 of 102 slices shown]
[im 1/102]
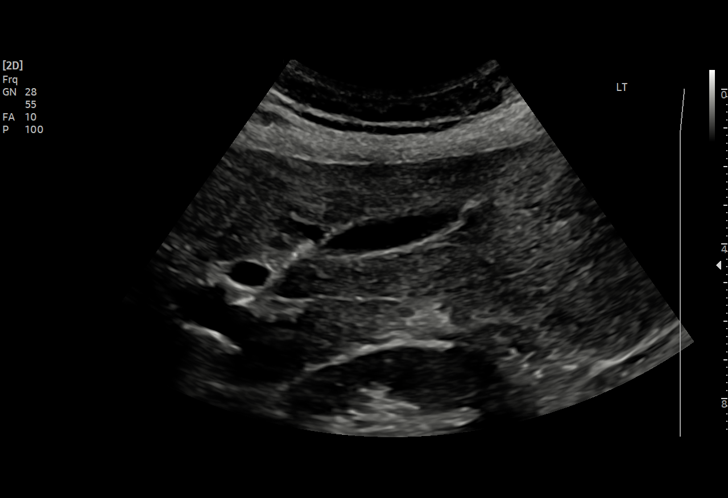
[im 9/102]
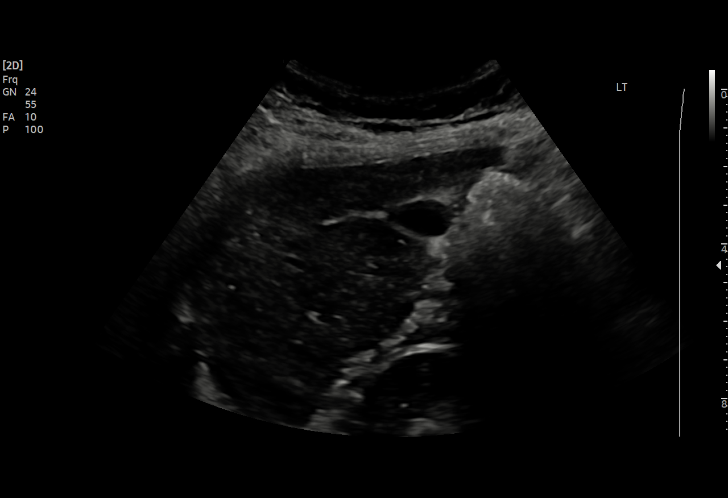
[im 17/102]
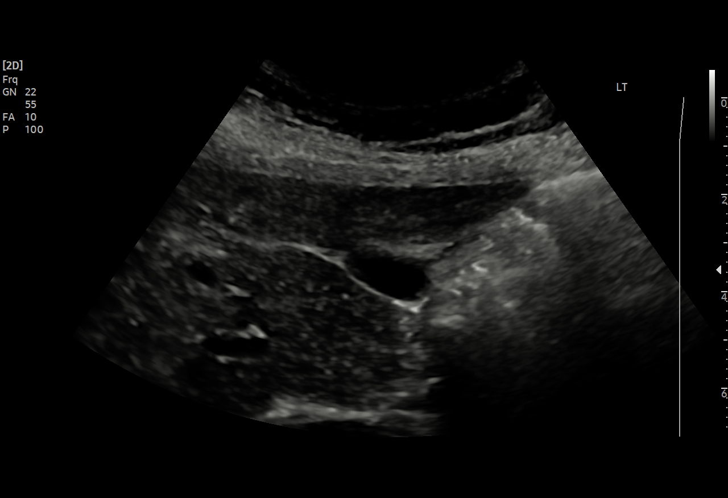
[im 22/102]
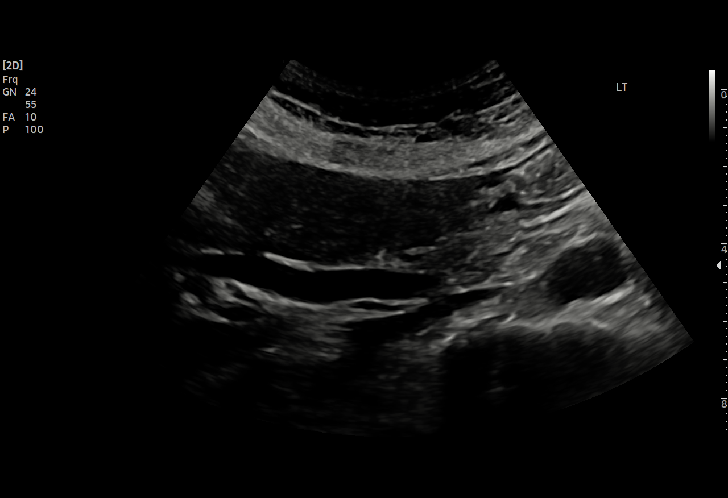
[im 30/102]
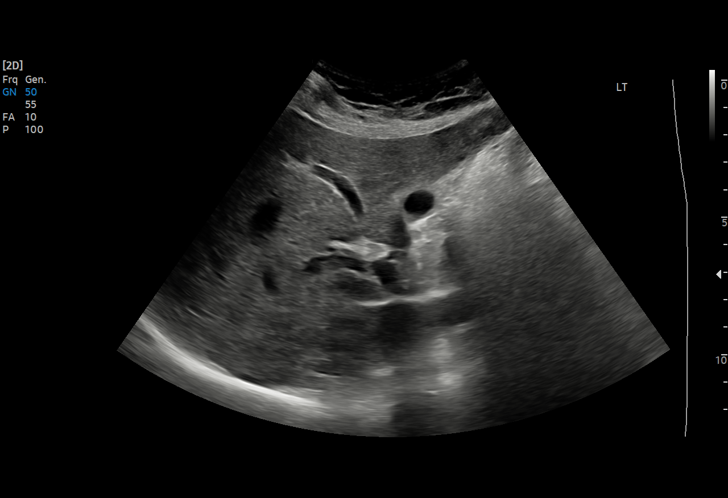
[im 38/102]
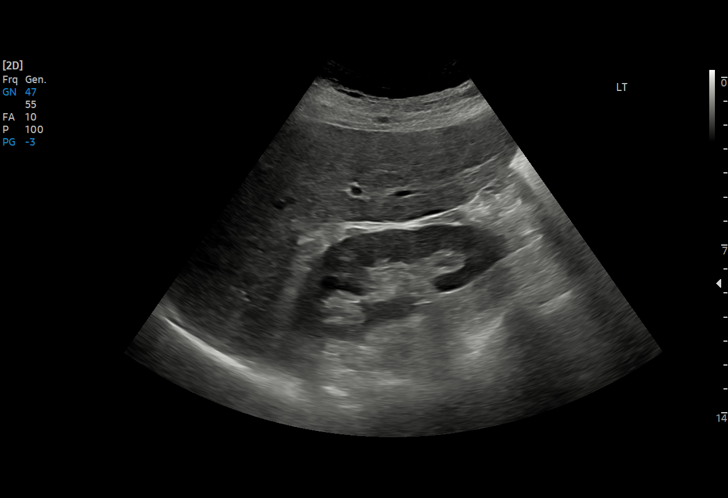
[im 43/102]
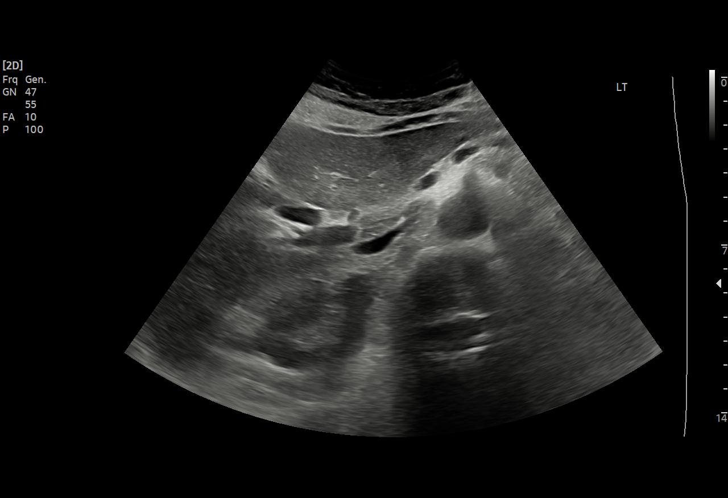
[im 51/102]
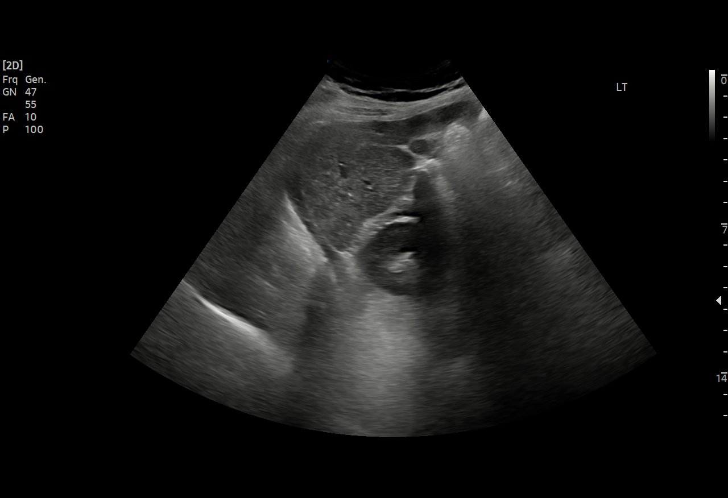
[im 59/102]
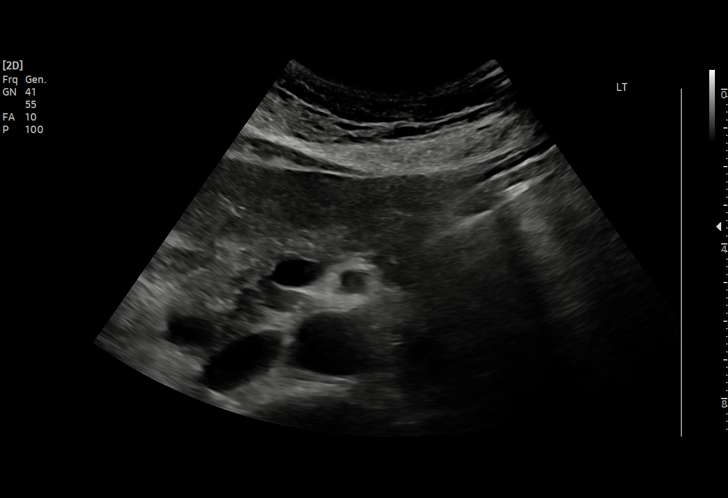
[im 64/102]
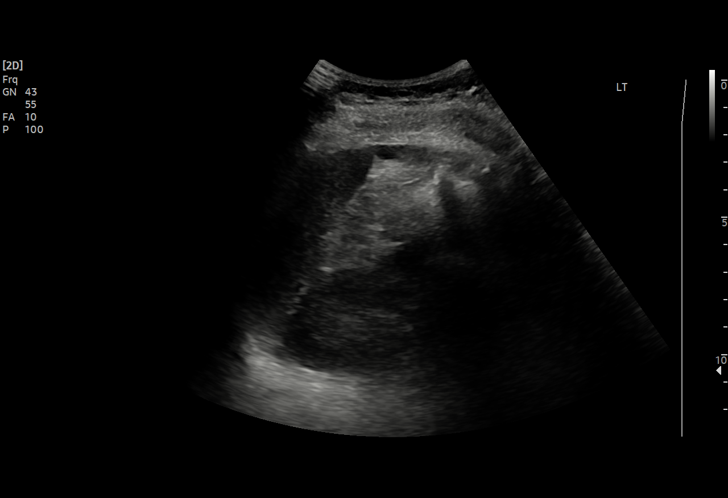
[im 72/102]
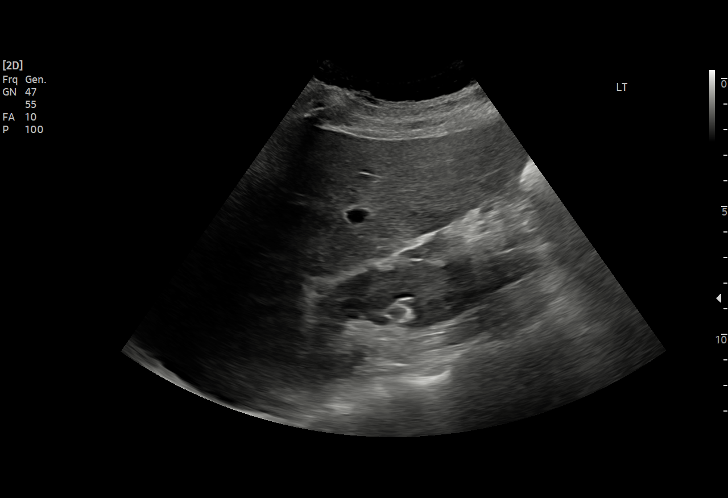
[im 80/102]
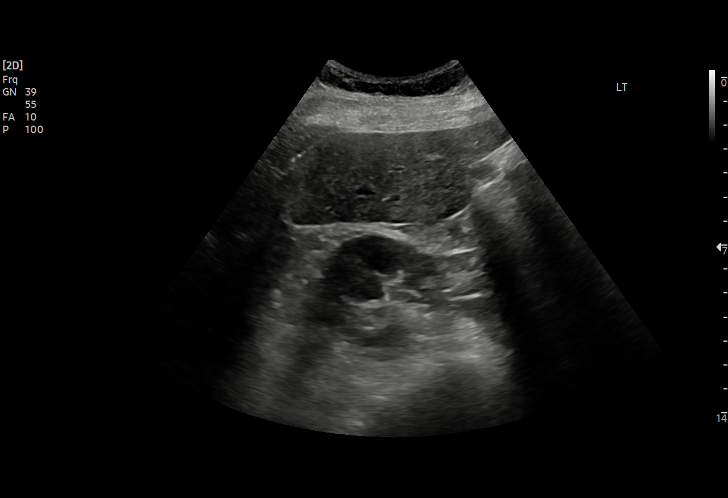
[im 85/102]
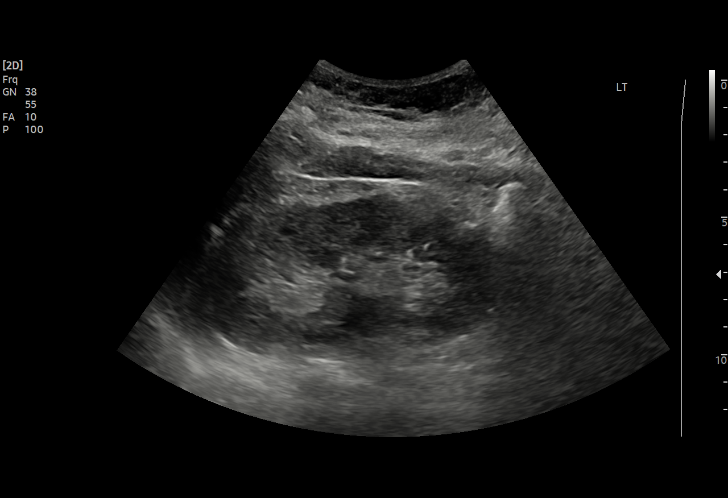
[im 93/102]
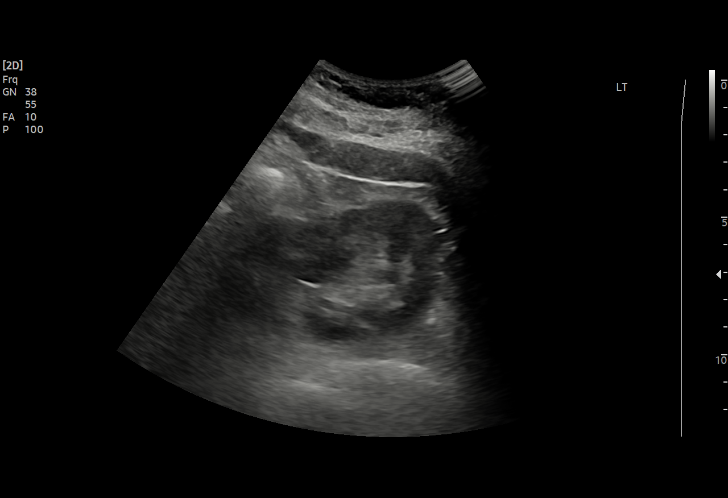
[im 102/102]
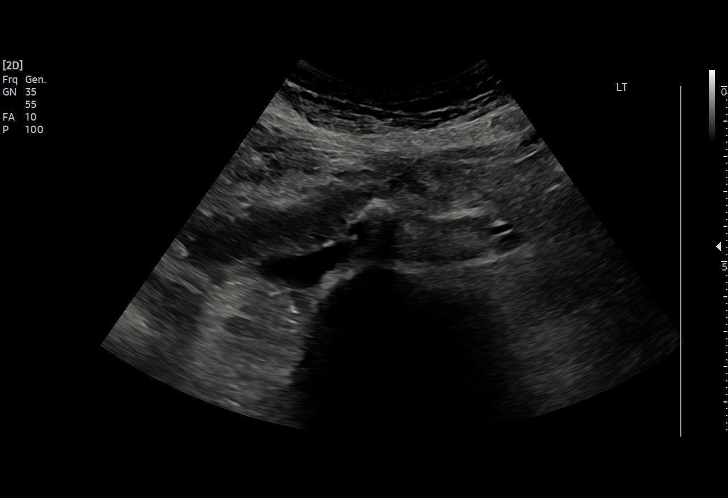

[15 of 25 positions shown; findings below may reference images not displayed]

FINDINGS: Gallbladder: No gallstones or wall thickening visualized. No
sonographic Murphy sign noted by sonographer.

Common bile duct: Diameter: 7.8 mm

Liver: Upper normal in size. Appears slightly echogenic. No focal
hepatic abnormality. Portal vein is patent on color Doppler imaging
with normal direction of blood flow towards the liver.

IVC: No abnormality visualized.

Pancreas: Visualized portion unremarkable.

Spleen: Size and appearance within normal limits.

Right Kidney: Length: 10.6 cm. Echogenicity within normal limits. No
mass or hydronephrosis visualized.

Left Kidney: Length: 11.4 cm. Echogenicity within normal limits. No
mass or hydronephrosis visualized.

Abdominal aorta: No aneurysm visualized.

Other findings: None.
IMPRESSION: 1. Negative for gallstones.
2. Slightly enlarged common bile duct up to 7.8 mm, recommend
correlation with LFTs. If ductal obstruction is suspected,
correlation with MRCP could be obtained
3. Slightly echogenic liver suggests steatosis.

## 2022-07-30 DIAGNOSIS — R0602 Shortness of breath: Secondary | ICD-10-CM | POA: Diagnosis not present

## 2022-07-30 DIAGNOSIS — G4733 Obstructive sleep apnea (adult) (pediatric): Secondary | ICD-10-CM | POA: Diagnosis not present

## 2022-07-31 DIAGNOSIS — Z1339 Encounter for screening examination for other mental health and behavioral disorders: Secondary | ICD-10-CM | POA: Diagnosis not present

## 2022-07-31 DIAGNOSIS — Z6826 Body mass index (BMI) 26.0-26.9, adult: Secondary | ICD-10-CM | POA: Diagnosis not present

## 2022-07-31 DIAGNOSIS — Z1331 Encounter for screening for depression: Secondary | ICD-10-CM | POA: Diagnosis not present

## 2022-07-31 DIAGNOSIS — Z0000 Encounter for general adult medical examination without abnormal findings: Secondary | ICD-10-CM | POA: Diagnosis not present

## 2022-07-31 DIAGNOSIS — M810 Age-related osteoporosis without current pathological fracture: Secondary | ICD-10-CM | POA: Diagnosis not present

## 2022-08-02 DIAGNOSIS — G4733 Obstructive sleep apnea (adult) (pediatric): Secondary | ICD-10-CM | POA: Diagnosis not present

## 2022-08-02 DIAGNOSIS — R0602 Shortness of breath: Secondary | ICD-10-CM | POA: Diagnosis not present

## 2022-11-13 DIAGNOSIS — M810 Age-related osteoporosis without current pathological fracture: Secondary | ICD-10-CM | POA: Diagnosis not present

## 2022-11-20 ENCOUNTER — Telehealth: Payer: Self-pay | Admitting: Cardiology

## 2022-11-20 NOTE — Telephone Encounter (Signed)
Patient having intermittent chest pain along with increased HR. Taking an additional dose of metoprolol. Not currently having active CP while on the phone.  Okay for pt to take additional dose of metoprolol?

## 2022-11-20 NOTE — Telephone Encounter (Signed)
STAT if HR is under 50 or over 120 (normal HR is 60-100 beats per minute)  What is your heart rate? 110  Do you have a log of your heart rate readings (document readings)? 16,109,60  Do you have any other symptoms? No

## 2022-11-21 MED ORDER — METOPROLOL SUCCINATE ER 25 MG PO TB24: 12.5000 mg | ORAL_TABLET | Freq: Two times a day (BID) | ORAL | Status: DC

## 2022-11-21 NOTE — Telephone Encounter (Signed)
Pt called back in. She states she is taking Metoprolol 12.5 mg in the morning and 12.5 mg at night (total 25 mg daily).

## 2022-11-21 NOTE — Telephone Encounter (Signed)
Pt c/b sent to nurse

## 2022-11-21 NOTE — Telephone Encounter (Signed)
Attempted to call pt, no answer. Left info on machine. Asked pt to call back to verify she received the message, so her medication list can be updated.

## 2022-11-22 NOTE — Telephone Encounter (Signed)
Spoke with Dr. Servando Salina, pt is okay to take Metoprolol 12.5 mg om he morning and 12.5 mg at night. No changes to how pt is taking medications.

## 2022-12-05 ENCOUNTER — Encounter: Payer: Self-pay | Admitting: Gastroenterology

## 2022-12-05 DIAGNOSIS — K090 Developmental odontogenic cysts: Secondary | ICD-10-CM | POA: Diagnosis not present

## 2023-01-09 DIAGNOSIS — Z961 Presence of intraocular lens: Secondary | ICD-10-CM | POA: Diagnosis not present

## 2023-01-09 DIAGNOSIS — H52223 Regular astigmatism, bilateral: Secondary | ICD-10-CM | POA: Diagnosis not present

## 2023-01-09 DIAGNOSIS — H04123 Dry eye syndrome of bilateral lacrimal glands: Secondary | ICD-10-CM | POA: Diagnosis not present

## 2023-01-09 DIAGNOSIS — H524 Presbyopia: Secondary | ICD-10-CM | POA: Diagnosis not present

## 2023-01-09 DIAGNOSIS — H5203 Hypermetropia, bilateral: Secondary | ICD-10-CM | POA: Diagnosis not present

## 2023-01-31 ENCOUNTER — Other Ambulatory Visit: Payer: Self-pay

## 2023-01-31 MED ORDER — ROSUVASTATIN CALCIUM 5 MG PO TABS: 5.0000 mg | ORAL_TABLET | Freq: Every day | ORAL | 3 refills | Status: AC

## 2023-03-24 DIAGNOSIS — L659 Nonscarring hair loss, unspecified: Secondary | ICD-10-CM | POA: Diagnosis not present

## 2023-03-24 DIAGNOSIS — Z6827 Body mass index (BMI) 27.0-27.9, adult: Secondary | ICD-10-CM | POA: Diagnosis not present

## 2023-05-02 ENCOUNTER — Other Ambulatory Visit: Payer: Self-pay | Admitting: Cardiology

## 2023-05-12 ENCOUNTER — Other Ambulatory Visit: Payer: Self-pay | Admitting: Gastroenterology

## 2023-07-28 ENCOUNTER — Ambulatory Visit: Admitting: Physician Assistant

## 2023-09-10 ENCOUNTER — Ambulatory Visit: Admitting: Physician Assistant

## 2023-09-10 ENCOUNTER — Encounter: Payer: Self-pay | Admitting: Physician Assistant

## 2023-09-10 VITALS — BP 100/68 | HR 68 | Ht 61.5 in | Wt 146.0 lb

## 2023-09-10 DIAGNOSIS — R140 Abdominal distension (gaseous): Secondary | ICD-10-CM | POA: Diagnosis not present

## 2023-09-10 DIAGNOSIS — R630 Anorexia: Secondary | ICD-10-CM

## 2023-09-10 DIAGNOSIS — K219 Gastro-esophageal reflux disease without esophagitis: Secondary | ICD-10-CM

## 2023-09-10 DIAGNOSIS — R0609 Other forms of dyspnea: Secondary | ICD-10-CM | POA: Diagnosis not present

## 2023-09-10 DIAGNOSIS — Z860101 Personal history of adenomatous and serrated colon polyps: Secondary | ICD-10-CM

## 2023-09-10 DIAGNOSIS — R1310 Dysphagia, unspecified: Secondary | ICD-10-CM | POA: Diagnosis not present

## 2023-09-10 DIAGNOSIS — R0602 Shortness of breath: Secondary | ICD-10-CM

## 2023-09-10 DIAGNOSIS — R1013 Epigastric pain: Secondary | ICD-10-CM | POA: Diagnosis not present

## 2023-09-10 MED ORDER — OMEPRAZOLE 20 MG PO CPDR: 20.0000 mg | DELAYED_RELEASE_CAPSULE | Freq: Every day | ORAL | 3 refills | Status: AC
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
## 2023-09-10 NOTE — Patient Instructions (Addendum)
 We have sent the following medications to your pharmacy for you to pick up at your convenience: Omeprazole  20 mg daily 30-60 minutes before breakfast.   Follow up with PCP for shortness of breath.    _______________________________________________________  If your blood pressure at your visit was 140/90 or greater, please contact your primary care physician to follow up on this.  _______________________________________________________  If you are age 67 or older, your body mass index should be between 23-30. Your There is no height or weight on file to calculate BMI. If this is out of the aforementioned range listed, please consider follow up with your Primary Care Provider.  If you are age 35 or younger, your body mass index should be between 19-25. Your There is no height or weight on file to calculate BMI. If this is out of the aformentioned range listed, please consider follow up with your Primary Care Provider.   ________________________________________________________  The [facility] providers would like to encourage you to use MYCHART to communicate with providers for non-urgent requests or questions.  Due to long hold times on the telephone, sending your provider a message by [facility] may be a faster and more efficient way to get a response.  Please allow 48 business hours for a response.  Please remember that this is for non-urgent requests.  _______________________________________________________  [facility] is using a team-based approach to care.  Your team is made up of your doctor and two to three APPS. Our APPS (Nurse Practitioners and Physician Assistants) work with your physician to ensure care continuity for you. They are fully qualified to address your health concerns and develop a treatment plan. They communicate directly with your gastroenterologist to care for you. Seeing the Advanced Practice Practitioners on your physician's team can help you by facilitating  care more promptly, often allowing for earlier appointments, access to diagnostic testing, procedures, and other specialty referrals.

## 2023-09-18 DIAGNOSIS — F411 Generalized anxiety disorder: Secondary | ICD-10-CM | POA: Diagnosis not present

## 2023-09-18 DIAGNOSIS — Z6827 Body mass index (BMI) 27.0-27.9, adult: Secondary | ICD-10-CM | POA: Diagnosis not present

## 2023-09-18 DIAGNOSIS — J189 Pneumonia, unspecified organism: Secondary | ICD-10-CM | POA: Diagnosis not present

## 2024-01-27 ENCOUNTER — Telehealth: Payer: Self-pay | Admitting: Physician Assistant

## 2024-01-27 NOTE — Telephone Encounter (Signed)
 Inbound call from patient stating that she feels she needs her throat stretched again. This morning she woke up feeling like she could catch her breathe and needed to throw up. Please advise.

## 2024-01-28 NOTE — Telephone Encounter (Signed)
 Patient seen in August 2025 for similar symptoms. Seen by Delon. Patient was to be seen by PCP for the chest tightness, shortness of breath and once done, GI would assess her for appropriateness and or get her scheduled for procedures pending the workup.   Called the patient. No answer. Left message recommending she call back and schedule an appointment for assessment.
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
## 2024-01-29 ENCOUNTER — Ambulatory Visit: Admitting: Physician Assistant

## 2024-01-29 ENCOUNTER — Encounter: Payer: Self-pay | Admitting: Physician Assistant

## 2024-01-29 VITALS — BP 90/50 | HR 78 | Ht 61.5 in | Wt 147.0 lb

## 2024-01-29 DIAGNOSIS — R0602 Shortness of breath: Secondary | ICD-10-CM

## 2024-01-29 DIAGNOSIS — Z860101 Personal history of adenomatous and serrated colon polyps: Secondary | ICD-10-CM | POA: Diagnosis not present

## 2024-01-29 DIAGNOSIS — R1310 Dysphagia, unspecified: Secondary | ICD-10-CM

## 2024-01-29 DIAGNOSIS — K219 Gastro-esophageal reflux disease without esophagitis: Secondary | ICD-10-CM | POA: Diagnosis not present

## 2024-01-29 DIAGNOSIS — R0609 Other forms of dyspnea: Secondary | ICD-10-CM

## 2024-01-29 NOTE — Patient Instructions (Signed)
 We are referring you to [facility].  They will contact you directly to schedule an appointment.  It may take a week or more before you hear from them.  Please feel free to contact us  if you have not heard from them within 2 weeks and we will follow up on the referral.    Once we have clearance from Pulmonology then we will consider scheduling Endo/Colonoscopy.  _______________________________________________________  If your blood pressure at your visit was 140/90 or greater, please contact your primary care physician to follow up on this.  _______________________________________________________  If you are age 28 or older, your body mass index should be between 23-30. Your Body mass index is 27.33 kg/m. If this is out of the aforementioned range listed, please consider follow up with your Primary Care Provider.  If you are age 33 or younger, your body mass index should be between 19-25. Your Body mass index is 27.33 kg/m. If this is out of the aformentioned range listed, please consider follow up with your Primary Care Provider.   ________________________________________________________  The [facility] providers would like to encourage you to use MYCHART to communicate with providers for non-urgent requests or questions.  Due to long hold times on the telephone, sending your provider a message by [facility] may be a faster and more efficient way to get a response.  Please allow 48 business hours for a response.  Please remember that this is for non-urgent requests.  _______________________________________________________  [facility] is using a team-based approach to care.  Your team is made up of your doctor and two to three APPS. Our APPS (Nurse Practitioners and Physician Assistants) work with your physician to ensure care continuity for you. They are fully qualified to address your health concerns and develop a treatment plan. They communicate directly with your  gastroenterologist to care for you. Seeing the Advanced Practice Practitioners on your physician's team can help you by facilitating care more promptly, often allowing for earlier appointments, access to diagnostic testing, procedures, and other specialty referrals.   Thank you for choosing me and [facility].  Delon Failing, PA-C

## 2024-02-11 ENCOUNTER — Ambulatory Visit

## 2024-02-11 ENCOUNTER — Encounter: Payer: Self-pay | Admitting: Pulmonary Disease

## 2024-02-11 ENCOUNTER — Ambulatory Visit: Admitting: Pulmonary Disease

## 2024-02-11 VITALS — BP 120/74 | HR 81 | Temp 97.8°F | Ht 61.0 in | Wt 148.0 lb

## 2024-02-11 DIAGNOSIS — R1310 Dysphagia, unspecified: Secondary | ICD-10-CM

## 2024-02-11 DIAGNOSIS — K224 Dyskinesia of esophagus: Secondary | ICD-10-CM | POA: Diagnosis not present

## 2024-02-11 DIAGNOSIS — Z8719 Personal history of other diseases of the digestive system: Secondary | ICD-10-CM | POA: Diagnosis not present

## 2024-02-11 DIAGNOSIS — R0609 Other forms of dyspnea: Secondary | ICD-10-CM | POA: Diagnosis not present

## 2024-02-11 DIAGNOSIS — J45909 Unspecified asthma, uncomplicated: Secondary | ICD-10-CM

## 2024-02-11 DIAGNOSIS — R0602 Shortness of breath: Secondary | ICD-10-CM | POA: Diagnosis not present

## 2024-02-11 DIAGNOSIS — F17210 Nicotine dependence, cigarettes, uncomplicated: Secondary | ICD-10-CM

## 2024-02-11 NOTE — Patient Instructions (Addendum)
 Nice to see you  Try Trelegy 1 puff once a day, rinse mouth out with water after every use.  Please let me know if this helps and I can prescribe it long-term.  I provided samples today.  Each sample will last 2 weeks.  There is a number on the inhaler that indicates a number of doses left.  Once it had 0, it is empty.  I ordered pulmonary function test to be performed at your convenience in the coming weeks.  This to give us  a diagnosis understanding shortness of breath  Chest x-ray today to make sure the lungs look okay.  Return to clinic in 2 months or sooner as needed with Dr. Annella.
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
## 2024-02-17 ENCOUNTER — Telehealth: Payer: Self-pay | Admitting: *Deleted

## 2024-02-17 ENCOUNTER — Ambulatory Visit: Payer: Self-pay | Admitting: Pulmonary Disease

## 2024-02-17 NOTE — Telephone Encounter (Signed)
 Nandigam, Kavitha V, MD sent to [address] [facility], Donnice SAUNDERS, MD; Bettie Grayce HERO, CMA Thank you for letting me know, we will bring her in for office visit and plan for repeat EGD at the hospital. Grayce, can you please schedule pt? Thanks       Previous Messages    ----- Message ----- From: Annella Donnice SAUNDERS, MD Sent: 02/17/2024   9:48 AM EST To: Kavitha Nandigam V, MD Subject: EGD                                            Good morning,  This is a patient of yours, last seen in 2023.  I saw this patient last week at the request of your office.  She describes several weeks of dysphagia, esophageal phase, like things getting stuck in her chest-this is what prompted presentation to GI office.  Similar symptoms in the past believe in 2023 she had a dilation, otherwise her EGD demonstrated some gastropathy, she has a hiatal hernia.  But she said the dilation did help her symptoms.  She does have some shortness of breath.  We are working this up.  I think what gave the PA pause was this description of a single episode of breathlessness.  Sounds like an episode of aspiration, quickly resolved.  Did not call 911 etc.  I think moving forward with the EGD would be very reasonable given her esophageal symptoms.  Out of abundance of caution this could be performed in a hospital setting to mitigate any risk.  Overall, I see no barrier from a pulmonary standpoint to having the procedure done.  Thanks, [employer]

## 2024-02-17 NOTE — Telephone Encounter (Signed)
 Called patient and scheduled her for 03/02/2024 at 1:30 pm . I offered her tomorrow 02/18/2024 but she couldn't make that appointment.

## 2024-02-26 ENCOUNTER — Other Ambulatory Visit: Payer: Self-pay | Admitting: Cardiology

## 2024-02-26 ENCOUNTER — Ambulatory Visit (HOSPITAL_BASED_OUTPATIENT_CLINIC_OR_DEPARTMENT_OTHER)
Admission: EM | Admit: 2024-02-26 | Discharge: 2024-02-26 | Disposition: A | Discharge status: Home / Self Care | Source: Home / Self Care

## 2024-02-26 ENCOUNTER — Ambulatory Visit (HOSPITAL_BASED_OUTPATIENT_CLINIC_OR_DEPARTMENT_OTHER): Admit: 2024-02-26 | Discharge: 2024-02-26 | Disposition: A | Admitting: Radiology

## 2024-02-26 ENCOUNTER — Encounter (HOSPITAL_BASED_OUTPATIENT_CLINIC_OR_DEPARTMENT_OTHER): Payer: Self-pay

## 2024-02-26 DIAGNOSIS — R109 Unspecified abdominal pain: Secondary | ICD-10-CM

## 2024-02-26 NOTE — Discharge Instructions (Signed)
 No concerns on your x-ray.  You do have some gas buildup in stool.  Recommend doing some prune juice or apple juice.  You can do Gas-X over-the-counter. Try applying some heat to the area.  Follow-up with your doctor for any continued issues
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
## 2024-02-26 NOTE — ED Triage Notes (Signed)
 Left side upper abdominal pain x 1 week. + constipation. States usually moves bowels twice daily. Feels full and bloated. Patient has a GI specialist. Patient denies urinary symptoms.

## 2024-03-02 ENCOUNTER — Ambulatory Visit: Admitting: Gastroenterology

## 2024-03-24 ENCOUNTER — Ambulatory Visit: Admitting: Pulmonary Disease

## 2024-03-24 ENCOUNTER — Encounter

## 2024-05-11 ENCOUNTER — Ambulatory Visit: Admitting: Pulmonary Disease

## 2024-05-11 ENCOUNTER — Encounter
